# Patient Record
Sex: Female | Born: 1970 | Race: White | Hispanic: Yes | Marital: Single | Smoking: Never smoker
Health system: Southern US, Community
[De-identification: ages and names within clinical notes are randomized; demographics above are authoritative.]

## PROBLEM LIST (undated history)

## (undated) DIAGNOSIS — F419 Anxiety disorder, unspecified: Secondary | ICD-10-CM

## (undated) DIAGNOSIS — D649 Anemia, unspecified: Secondary | ICD-10-CM

## (undated) DIAGNOSIS — D219 Benign neoplasm of connective and other soft tissue, unspecified: Secondary | ICD-10-CM

## (undated) DIAGNOSIS — O09529 Supervision of elderly multigravida, unspecified trimester: Secondary | ICD-10-CM

## (undated) DIAGNOSIS — Z8742 Personal history of other diseases of the female genital tract: Secondary | ICD-10-CM

## (undated) DIAGNOSIS — N393 Stress incontinence (female) (male): Secondary | ICD-10-CM

## (undated) HISTORY — DX: Supervision of elderly multigravida, unspecified trimester: O09.529

## (undated) HISTORY — DX: Stress incontinence (female) (male): N39.3

## (undated) HISTORY — DX: Anemia, unspecified: D64.9

## (undated) HISTORY — DX: Personal history of other diseases of the female genital tract: Z87.42

## (undated) HISTORY — DX: Benign neoplasm of connective and other soft tissue, unspecified: D21.9

---

## 2003-09-16 ENCOUNTER — Other Ambulatory Visit: Admission: RE | Admit: 2003-09-16 | Discharge: 2003-09-16 | Payer: Self-pay | Admitting: Family Medicine

## 2008-07-15 ENCOUNTER — Ambulatory Visit (HOSPITAL_COMMUNITY): Admission: RE | Admit: 2008-07-15 | Discharge: 2008-07-15 | Payer: Self-pay | Admitting: Family Medicine

## 2011-05-28 ENCOUNTER — Other Ambulatory Visit: Payer: Self-pay | Admitting: Obstetrics & Gynecology

## 2011-05-28 DIAGNOSIS — Z139 Encounter for screening, unspecified: Secondary | ICD-10-CM

## 2011-06-04 ENCOUNTER — Ambulatory Visit (HOSPITAL_COMMUNITY)
Admission: RE | Admit: 2011-06-04 | Discharge: 2011-06-04 | Disposition: A | Discharge status: Home / Self Care | Payer: PRIVATE HEALTH INSURANCE | Source: Ambulatory Visit | Attending: Obstetrics & Gynecology | Admitting: Obstetrics & Gynecology

## 2011-06-04 DIAGNOSIS — Z139 Encounter for screening, unspecified: Secondary | ICD-10-CM

## 2011-06-04 DIAGNOSIS — Z1231 Encounter for screening mammogram for malignant neoplasm of breast: Secondary | ICD-10-CM | POA: Insufficient documentation

## 2012-06-26 ENCOUNTER — Other Ambulatory Visit: Payer: Self-pay | Admitting: Obstetrics & Gynecology

## 2012-06-26 DIAGNOSIS — Z139 Encounter for screening, unspecified: Secondary | ICD-10-CM

## 2012-07-21 ENCOUNTER — Other Ambulatory Visit: Payer: Self-pay | Admitting: Obstetrics & Gynecology

## 2012-07-21 ENCOUNTER — Other Ambulatory Visit (HOSPITAL_COMMUNITY)
Admission: RE | Admit: 2012-07-21 | Discharge: 2012-07-21 | Disposition: A | Discharge status: Home / Self Care | Payer: PRIVATE HEALTH INSURANCE | Source: Ambulatory Visit | Attending: Obstetrics & Gynecology | Admitting: Obstetrics & Gynecology

## 2012-07-21 ENCOUNTER — Ambulatory Visit (HOSPITAL_COMMUNITY)
Admission: RE | Admit: 2012-07-21 | Discharge: 2012-07-21 | Disposition: A | Discharge status: Home / Self Care | Payer: PRIVATE HEALTH INSURANCE | Source: Ambulatory Visit | Attending: Obstetrics & Gynecology | Admitting: Obstetrics & Gynecology

## 2012-07-21 DIAGNOSIS — Z1231 Encounter for screening mammogram for malignant neoplasm of breast: Secondary | ICD-10-CM | POA: Insufficient documentation

## 2012-07-21 DIAGNOSIS — Z01419 Encounter for gynecological examination (general) (routine) without abnormal findings: Secondary | ICD-10-CM | POA: Insufficient documentation

## 2012-07-21 DIAGNOSIS — Z139 Encounter for screening, unspecified: Secondary | ICD-10-CM

## 2012-07-21 DIAGNOSIS — R87810 Cervical high risk human papillomavirus (HPV) DNA test positive: Secondary | ICD-10-CM | POA: Insufficient documentation

## 2012-07-27 NOTE — Letter (Signed)
July 27, 2012  ATTN:  RE:      Loreta, Blouch [facility] # [id number]          Date of Service:   ADDRESS  [address] [place], [place] [address]  BODY  Dear Lafonda Mosses,  The Pap smear you had performed in our office on August 6th with Dr. 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  He would like to do a special procedure called a colposcopy and I am enclosing for your review 2 brochures, 1 on abnormal Pap and 1 on colposcopy.  If you will give our office a call at [phone], you can set that appointment up with Dr. Emelda Fear or Dr. Despina Hidden.          ______________________________ Adline Potter, ANP/GNP    JAG/MEDQ  D:  07/27/2012  T:  07/27/2012  Job:  [id number]

## 2013-07-22 ENCOUNTER — Other Ambulatory Visit: Payer: Self-pay

## 2013-07-22 DIAGNOSIS — Z1231 Encounter for screening mammogram for malignant neoplasm of breast: Secondary | ICD-10-CM

## 2013-07-26 ENCOUNTER — Ambulatory Visit
Admission: RE | Admit: 2013-07-26 | Discharge: 2013-07-26 | Disposition: A | Discharge status: Home / Self Care | Payer: BC Managed Care – PPO | Source: Ambulatory Visit

## 2013-07-26 DIAGNOSIS — Z1231 Encounter for screening mammogram for malignant neoplasm of breast: Secondary | ICD-10-CM

## 2013-08-03 ENCOUNTER — Other Ambulatory Visit: Payer: Self-pay | Admitting: Obstetrics & Gynecology

## 2013-08-23 ENCOUNTER — Other Ambulatory Visit (HOSPITAL_COMMUNITY)
Admission: RE | Admit: 2013-08-23 | Discharge: 2013-08-23 | Disposition: A | Discharge status: Home / Self Care | Payer: BC Managed Care – PPO | Source: Ambulatory Visit | Attending: Obstetrics & Gynecology | Admitting: Obstetrics & Gynecology

## 2013-08-23 ENCOUNTER — Ambulatory Visit (INDEPENDENT_AMBULATORY_CARE_PROVIDER_SITE_OTHER): Payer: BC Managed Care – PPO | Admitting: Obstetrics & Gynecology

## 2013-08-23 ENCOUNTER — Encounter: Payer: Self-pay | Admitting: Obstetrics & Gynecology

## 2013-08-23 ENCOUNTER — Other Ambulatory Visit: Payer: Self-pay | Admitting: Obstetrics & Gynecology

## 2013-08-23 VITALS — BP 90/60 | Ht 60.0 in | Wt 124.0 lb

## 2013-08-23 DIAGNOSIS — Z01419 Encounter for gynecological examination (general) (routine) without abnormal findings: Secondary | ICD-10-CM

## 2013-08-23 DIAGNOSIS — N841 Polyp of cervix uteri: Secondary | ICD-10-CM

## 2013-08-23 NOTE — Progress Notes (Signed)
Patient ID: Elizabeth [name], female   DOB: 1971/02/27, 42 y.o.   MRN: [id number] Subjective:     [name] is a 42 y.o. female here for a routine exam.  Patient's last menstrual period was 08/02/2013. No obstetric history on file. Current complaints: none.  Personal health questionnaire reviewed: no.   Gynecologic History Patient's last menstrual period was 08/02/2013. Contraception: none Last Pap: 2013. Results were: normal Last mammogram: 2014. Results were: normal  Obstetric History OB History  No data available     The following portions of the patient's history were reviewed and updated as appropriate: allergies, current medications, past family history, past medical history, past social history, past surgical history and problem list.  Review of Systems  Review of Systems  Constitutional: Negative for fever, chills, weight loss, malaise/fatigue and diaphoresis.  HENT: Negative for hearing loss, ear pain, nosebleeds, congestion, sore throat, neck pain, tinnitus and ear discharge.   Eyes: Negative for blurred vision, double vision, photophobia, pain, discharge and redness.  Respiratory: Negative for cough, hemoptysis, sputum production, shortness of breath, wheezing and stridor.   Cardiovascular: Negative for chest pain, palpitations, orthopnea, claudication, leg swelling and PND.  Gastrointestinal: negative for abdominal pain. Negative for heartburn, nausea, vomiting, diarrhea, constipation, blood in stool and melena.  Genitourinary: Negative for dysuria, urgency, frequency, hematuria and flank pain.  Musculoskeletal: Negative for myalgias, back pain, joint pain and falls.  Skin: Negative for itching and rash.  Neurological: Negative for dizziness, tingling, tremors, sensory change, speech change, focal weakness, seizures, loss of consciousness, weakness and headaches.  Endo/Heme/Allergies: Negative for environmental allergies and polydipsia. Does not bruise/bleed easily.   Psychiatric/Behavioral: Negative for depression, suicidal ideas, hallucinations, memory loss and substance abuse. The patient is not nervous/anxious and does not have insomnia.        Objective:    Physical Exam  Vitals reviewed. Constitutional: She is oriented to person, place, and time. She appears well-developed and well-nourished.  HENT:  Head: Normocephalic and atraumatic.        Right Ear: External ear normal.  Left Ear: External ear normal.  Nose: Nose normal.  Mouth/Throat: Oropharynx is clear and moist.  Eyes: Conjunctivae and EOM are normal. Pupils are equal, round, and reactive to light. Right eye exhibits no discharge. Left eye exhibits no discharge. No scleral icterus.  Neck: Normal range of motion. Neck supple. No tracheal deviation present. No thyromegaly present.  Cardiovascular: Normal rate, regular rhythm, normal heart sounds and intact distal pulses.  Exam reveals no gallop and no friction rub.   No murmur heard. Respiratory: Effort normal and breath sounds normal. No respiratory distress. She has no wheezes. She has no rales. She exhibits no tenderness.  GI: Soft. Bowel sounds are normal. She exhibits no distension and no mass. There is no tenderness. There is no rebound and no guarding.  Genitourinary:       Vulva is normal without lesions Vagina is pink moist without discharge Cervix normal in appearance and pap is done, small endocervical polyp seen and removed and sent to pathology Uterus is normal size shape and contour Adnexa is negative with normal sized ovaries   Musculoskeletal: Normal range of motion. She exhibits no edema and no tenderness.  Neurological: She is alert and oriented to person, place, and time. She has normal reflexes. She displays normal reflexes. No cranial nerve deficit. She exhibits normal muscle tone. Coordination normal.  Skin: Skin is warm and dry. No rash noted. No erythema. No pallor.  Psychiatric: She has  a normal mood and affect.  Her behavior is normal. Judgment and thought content normal.       Assessment:    Healthy female exam.    Plan:    Follow up in: 1 year.

## 2013-09-24 ENCOUNTER — Ambulatory Visit
Admission: RE | Admit: 2013-09-24 | Discharge: 2013-09-24 | Disposition: A | Discharge status: Home / Self Care | Payer: BC Managed Care – PPO | Source: Ambulatory Visit | Attending: Family Medicine | Admitting: Family Medicine

## 2013-09-24 ENCOUNTER — Other Ambulatory Visit: Payer: Self-pay | Admitting: Family Medicine

## 2013-09-24 DIAGNOSIS — R05 Cough: Secondary | ICD-10-CM

## 2013-09-24 DIAGNOSIS — R0789 Other chest pain: Secondary | ICD-10-CM

## 2014-03-04 ENCOUNTER — Inpatient Hospital Stay (HOSPITAL_COMMUNITY)
Admission: AD | Admit: 2014-03-04 | Payer: BC Managed Care – PPO | Source: Ambulatory Visit | Admitting: Obstetrics & Gynecology

## 2014-03-04 LAB — OB RESULTS CONSOLE GC/CHLAMYDIA
CHLAMYDIA, DNA PROBE: NEGATIVE
GC PROBE AMP, GENITAL: NEGATIVE

## 2014-03-04 LAB — OB RESULTS CONSOLE ANTIBODY SCREEN: ANTIBODY SCREEN: NEGATIVE

## 2014-03-04 LAB — OB RESULTS CONSOLE ABO/RH: RH Type: POSITIVE

## 2014-03-04 LAB — OB RESULTS CONSOLE HEPATITIS B SURFACE ANTIGEN: Hepatitis B Surface Ag: NEGATIVE

## 2014-03-04 LAB — OB RESULTS CONSOLE HIV ANTIBODY (ROUTINE TESTING): HIV: NONREACTIVE

## 2014-03-04 LAB — OB RESULTS CONSOLE RUBELLA ANTIBODY, IGM: Rubella: IMMUNE

## 2014-03-04 LAB — OB RESULTS CONSOLE RPR: RPR: NONREACTIVE

## 2014-08-17 LAB — OB RESULTS CONSOLE GBS: GBS: NEGATIVE

## 2014-09-07 ENCOUNTER — Encounter (HOSPITAL_COMMUNITY): Payer: Self-pay | Admitting: *Deleted

## 2014-09-07 ENCOUNTER — Telehealth (HOSPITAL_COMMUNITY): Payer: Self-pay | Admitting: *Deleted

## 2014-09-07 NOTE — Telephone Encounter (Signed)
Preadmission screen  

## 2014-09-13 ENCOUNTER — Other Ambulatory Visit: Payer: Self-pay | Admitting: Obstetrics & Gynecology

## 2014-09-18 ENCOUNTER — Inpatient Hospital Stay (HOSPITAL_COMMUNITY)
Admission: RE | Admit: 2014-09-18 | Discharge: 2014-09-22 | DRG: 766 | Disposition: A | Discharge status: Home / Self Care | Payer: BC Managed Care – PPO | Source: Ambulatory Visit | Attending: Obstetrics & Gynecology | Admitting: Obstetrics & Gynecology

## 2014-09-18 ENCOUNTER — Inpatient Hospital Stay (HOSPITAL_COMMUNITY): Payer: BC Managed Care – PPO | Admitting: Anesthesiology

## 2014-09-18 ENCOUNTER — Encounter (HOSPITAL_COMMUNITY): Payer: BC Managed Care – PPO | Admitting: Anesthesiology

## 2014-09-18 ENCOUNTER — Encounter (HOSPITAL_COMMUNITY): Payer: Self-pay

## 2014-09-18 DIAGNOSIS — O3483 Maternal care for other abnormalities of pelvic organs, third trimester: Secondary | ICD-10-CM | POA: Diagnosis present

## 2014-09-18 DIAGNOSIS — O3413 Maternal care for benign tumor of corpus uteri, third trimester: Secondary | ICD-10-CM | POA: Diagnosis present

## 2014-09-18 DIAGNOSIS — Z3A39 39 weeks gestation of pregnancy: Secondary | ICD-10-CM | POA: Diagnosis present

## 2014-09-18 DIAGNOSIS — O09513 Supervision of elderly primigravida, third trimester: Secondary | ICD-10-CM | POA: Diagnosis not present

## 2014-09-18 DIAGNOSIS — Z3403 Encounter for supervision of normal first pregnancy, third trimester: Secondary | ICD-10-CM | POA: Diagnosis present

## 2014-09-18 DIAGNOSIS — N8320 Unspecified ovarian cysts: Secondary | ICD-10-CM | POA: Diagnosis present

## 2014-09-18 DIAGNOSIS — Z8249 Family history of ischemic heart disease and other diseases of the circulatory system: Secondary | ICD-10-CM

## 2014-09-18 DIAGNOSIS — D259 Leiomyoma of uterus, unspecified: Secondary | ICD-10-CM | POA: Diagnosis present

## 2014-09-18 DIAGNOSIS — O3663X0 Maternal care for excessive fetal growth, third trimester, not applicable or unspecified: Secondary | ICD-10-CM | POA: Diagnosis present

## 2014-09-18 HISTORY — DX: Anxiety disorder, unspecified: F41.9

## 2014-09-18 LAB — ABO/RH: ABO/RH(D): O POS

## 2014-09-18 LAB — CBC
HEMATOCRIT: 34.9 % — AB (ref 36.0–46.0)
Hemoglobin: 12.3 g/dL (ref 12.0–15.0)
MCH: 32.6 pg (ref 26.0–34.0)
MCHC: 35.2 g/dL (ref 30.0–36.0)
MCV: 92.6 fL (ref 78.0–100.0)
Platelets: 210 10*3/uL (ref 150–400)
RBC: 3.77 MIL/uL — ABNORMAL LOW (ref 3.87–5.11)
RDW: 13.8 % (ref 11.5–15.5)
WBC: 6.6 10*3/uL (ref 4.0–10.5)

## 2014-09-18 LAB — TYPE AND SCREEN
ABO/RH(D): O POS
ANTIBODY SCREEN: NEGATIVE

## 2014-09-18 LAB — RPR

## 2014-09-18 MED ORDER — PHENYLEPHRINE 40 MCG/ML (10ML) SYRINGE FOR IV PUSH (FOR BLOOD PRESSURE SUPPORT): 80.0000 ug | PREFILLED_SYRINGE | INTRAVENOUS | Status: DC | PRN

## 2014-09-18 MED ORDER — LIDOCAINE HCL (PF) 1 % IJ SOLN
30.0000 mL | INTRAMUSCULAR | Status: DC | PRN
  Filled 2014-09-18: qty 30

## 2014-09-18 MED ORDER — PHENYLEPHRINE 40 MCG/ML (10ML) SYRINGE FOR IV PUSH (FOR BLOOD PRESSURE SUPPORT)
80.0000 ug | PREFILLED_SYRINGE | INTRAVENOUS | Status: DC | PRN
  Filled 2014-09-18: qty 10

## 2014-09-18 MED ORDER — OXYCODONE-ACETAMINOPHEN 5-325 MG PO TABS
1.0000 | ORAL_TABLET | ORAL | Status: DC | PRN
Start: 2014-09-18 — End: 2014-09-19

## 2014-09-18 MED ORDER — TERBUTALINE SULFATE 1 MG/ML IJ SOLN: 0.2500 mg | Freq: Once | INTRAMUSCULAR | Status: AC | PRN

## 2014-09-18 MED ORDER — EPHEDRINE 5 MG/ML INJ: 10.0000 mg | INTRAVENOUS | Status: DC | PRN

## 2014-09-18 MED ORDER — CITRIC ACID-SODIUM CITRATE 334-500 MG/5ML PO SOLN
30.0000 mL | ORAL | Status: DC | PRN
  Administered 2014-09-18: 30 mL via ORAL
  Filled 2014-09-18: qty 15

## 2014-09-18 MED ORDER — FENTANYL 2.5 MCG/ML BUPIVACAINE 1/10 % EPIDURAL INFUSION (WH - ANES)
14.0000 mL/h | INTRAMUSCULAR | Status: DC | PRN
  Administered 2014-09-18: 14 mL/h via EPIDURAL

## 2014-09-18 MED ORDER — ONDANSETRON HCL 4 MG/2ML IJ SOLN: 4.0000 mg | Freq: Four times a day (QID) | INTRAMUSCULAR | Status: DC | PRN

## 2014-09-18 MED ORDER — OXYTOCIN 40 UNITS IN LACTATED RINGERS INFUSION - SIMPLE MED
1.0000 m[IU]/min | INTRAVENOUS | Status: DC
  Administered 2014-09-18: 2 m[IU]/min via INTRAVENOUS
  Administered 2014-09-19: 40 [IU] via INTRAVENOUS

## 2014-09-18 MED ORDER — LIDOCAINE HCL (PF) 1 % IJ SOLN
INTRAMUSCULAR | Status: DC | PRN
  Administered 2014-09-18 (×2): 5 mL

## 2014-09-18 MED ORDER — LACTATED RINGERS IV SOLN
500.0000 mL | Freq: Once | INTRAVENOUS | Status: AC
  Administered 2014-09-18: 17:00:00 via INTRAVENOUS

## 2014-09-18 MED ORDER — CEFAZOLIN SODIUM-DEXTROSE 2-3 GM-% IV SOLR
2.0000 g | INTRAVENOUS | Status: AC
  Administered 2014-09-19: 2 g via INTRAVENOUS
  Filled 2014-09-18: qty 50

## 2014-09-18 MED ORDER — ACETAMINOPHEN 325 MG PO TABS: 650.0000 mg | ORAL_TABLET | ORAL | Status: DC | PRN

## 2014-09-18 MED ORDER — PHENYLEPHRINE 40 MCG/ML (10ML) SYRINGE FOR IV PUSH (FOR BLOOD PRESSURE SUPPORT)
PREFILLED_SYRINGE | INTRAVENOUS | Status: DC
Start: 2014-09-18 — End: 2014-09-18
  Filled 2014-09-18: qty 10

## 2014-09-18 MED ORDER — DIPHENHYDRAMINE HCL 50 MG/ML IJ SOLN: 12.5000 mg | INTRAMUSCULAR | Status: DC | PRN

## 2014-09-18 MED ORDER — OXYCODONE-ACETAMINOPHEN 5-325 MG PO TABS
2.0000 | ORAL_TABLET | ORAL | Status: DC | PRN
Start: 2014-09-18 — End: 2014-09-19

## 2014-09-18 MED ORDER — LACTATED RINGERS IV SOLN: 500.0000 mL | INTRAVENOUS | Status: DC | PRN

## 2014-09-18 MED ORDER — FENTANYL 2.5 MCG/ML BUPIVACAINE 1/10 % EPIDURAL INFUSION (WH - ANES)
INTRAMUSCULAR | Status: AC
  Administered 2014-09-18: 14 mL/h via EPIDURAL
  Filled 2014-09-18: qty 125

## 2014-09-18 MED ORDER — OXYTOCIN 40 UNITS IN LACTATED RINGERS INFUSION - SIMPLE MED
1.0000 m[IU]/min | INTRAVENOUS | Status: DC
  Filled 2014-09-18: qty 1000

## 2014-09-18 MED ORDER — LACTATED RINGERS IV SOLN
INTRAVENOUS | Status: DC
  Administered 2014-09-18: 20:00:00 via INTRAVENOUS

## 2014-09-18 MED ORDER — OXYTOCIN 40 UNITS IN LACTATED RINGERS INFUSION - SIMPLE MED: 62.5000 mL/h | INTRAVENOUS | Status: DC

## 2014-09-18 MED ORDER — OXYTOCIN BOLUS FROM INFUSION: 500.0000 mL | INTRAVENOUS | Status: DC

## 2014-09-18 MED ORDER — SODIUM BICARBONATE 8.4 % IV SOLN
INTRAVENOUS | Status: DC | PRN
  Administered 2014-09-18 – 2014-09-19 (×3): 5 mL via EPIDURAL

## 2014-09-18 NOTE — Anesthesia Procedure Notes (Signed)
Epidural Patient location during procedure: OB Start time: 09/18/2014 5:00 PM  Staffing Anesthesiologist: Rudean Curt Performed by: anesthesiologist   Preanesthetic Checklist Completed: patient identified, site marked, surgical consent, pre-op evaluation, timeout performed, IV checked, risks and benefits discussed and monitors and equipment checked  Epidural Patient position: sitting Prep: site prepped and draped and DuraPrep Patient monitoring: continuous pulse ox and blood pressure Approach: midline Location: L3-L4 Injection technique: LOR air  Needle:  Needle type: Tuohy  Needle gauge: 17 G Needle length: 9 cm and 9 Needle insertion depth: 5 cm cm Catheter type: closed end flexible Catheter size: 19 Gauge Catheter at skin depth: 10 cm Test dose: negative  Assessment Events: blood not aspirated, injection not painful, no injection resistance, negative IV test and no paresthesia  Additional Notes Patient identified.  Risk benefits discussed including failed block, incomplete pain control, headache, nerve damage, paralysis, blood pressure changes, nausea, vomiting, reactions to medication both toxic or allergic, and postpartum back pain.  Patient expressed understanding and wished to proceed.  All questions were answered.  Sterile technique used throughout procedure and epidural site dressed with sterile barrier dressing. No paresthesia or other complications noted.The patient did not experience any signs of intravascular injection such as tinnitus or metallic taste in mouth nor signs of intrathecal spread such as rapid motor block. Please see nursing notes for vital signs.

## 2014-09-18 NOTE — Progress Notes (Signed)
Elizabeth [name] is a 43 y.o. G1P0 at [redacted]w[redacted]d by sono. Here for labor AOL at 4-5 cm. EFW 9 + lbs. Uterine fiboids, AMA.  On Low dose pitocin, s/p epidural, progressed to 9+ cm at 8pm and complete at around 9 pm. Feels rectal pressure, but pushing was deferred an hour back due to high station. Will reassess and start pushing if lower station.   Objective: BP 112/57  Pulse 81  Temp(Src) 98.4 F (36.9 C) (Oral)  Resp 18  Ht 5\' 1"  (1.549 m)  Wt 158 lb (71.668 kg)  BMI 29.87 kg/m2  SpO2 100%   FHT:  FHR: 150 bpm, variability: moderate,  accelerations:  Present, decels: absent  Toco-   3 min  Assessment / Plan: Passive decent attempted x 2 hrs, start pushing if station lower and reassess progress. Continue pitocin. FHT -I. EFW 9 lbs. GBS neg.    Elizabeth [name] R 09/18/2014, 11:00 PM

## 2014-09-18 NOTE — H&P (Signed)
Elizabeth [name] is a 43 y.o. female presenting for labor AOL at 39 wks since cervix 5 cm dilated.  [facility], Dr Benjie Karvonen primary.  43 yo, AMA, spontaneous pregnancy, has fibroids and LGA. Passed Glucose screening, Informaseq normal XX, AFP negative. Gradual cervical dilatation since 34 wks, no leaking/ bleeding and feels good FMs. ANteig with BPP and AFI vs NSTs since 36 wks has been reactive and 8/8. Growth sono since 28 wks noted LGA, last sono at 36 wks noted 95% at 7'11" with AC at 98%.  History OB History   Grav Para Term Preterm Abortions TAB SAB Ect Mult Living   1              Past Medical History  Diagnosis Date  . Anemia   . History of ovarian cyst   . AMA (advanced maternal age) multigravida 65+   . Fibroid   . Female stress incontinence   . Anxiety    Past Surgical History  Procedure Laterality Date  . No past surgeries     Family History: family history includes Cancer in her father; Colon cancer in her father; Hypertension in her father. Social History:  reports that she has never smoked. She has never used smokeless tobacco. She reports that she does not drink alcohol or use illicit drugs.   Prenatal Transfer Tool  Maternal Diabetes: No Genetic Screening: Normal Informaseq and AFP1 negative Maternal Ultrasounds/Referrals: Normal but LGA -95% overall and AC at 98% Fetal Ultrasounds or other Referrals:  None Maternal Substance Abuse:  No Significant Maternal Medications:  Meds include: Other: Pepcid Significant Maternal Lab Results:  Lab values include: Group B Strep negative Other Comments:  None  ROS neg   Dilation: 5 Effacement (%): 50 Station: -1 Exam by:: lee Blood pressure 112/75, pulse 91, temperature 98.2 F (36.8 C), temperature source Oral, resp. rate 18, height 5\' 1"  (1.549 m), weight 158 lb (71.668 kg). Exam Physical Exam  Physical exam:  A&O x 3, no acute distress. Pleasant HEENT neg, no thyromegaly Lungs CTA bilat CV RRR, S1S2  normal Abdo soft, non tender, non acute Extr no edema/ tenderness Pelvic above FHT 130s/ + accels/ no decels/ mod variab- category I Toco irreg   Prenatal labs: ABO, Rh: --/--/O POS (10/04 0740) Antibody: NEG (10/04 0740) Rubella: Immune (03/20 0000) RPR: Nonreactive (03/20 0000)  HBsAg: Negative (03/20 0000)  HIV: Non-reactive (03/20 0000)  GBS: Negative (09/02 0000)   Assessment/Plan: 43 yo, AMA G1 at 39 wks for labor AOL. GBS negative, Large fibroids, placenta lateral, LGA, EFW 9 lbs. Pitocin, epidural as desired. FHT- category I  Gunner Iodice R 09/18/2014, 9:57 AM

## 2014-09-18 NOTE — Progress Notes (Signed)
Patient ID: Elizabeth [name], female   DOB: 06-05-1971, 43 y.o.   MRN: [id number] Subjective: No complaints.   Objective: BP 122/67  Pulse 80  Temp(Src) 98.2 F (36.8 C) (Oral)  Resp 16  Ht 5\' 1"  (1.549 m)  Wt 158 lb (71.668 kg)  BMI 29.87 kg/m2  FHT:  FHR: 145 bpm, variability: moderate,  accelerations:  Present,  decelerations:  Absent UC:   regular, every 3-4 minutes SVE:   Dilation: 4 Effacement (%): 50 Station: -2 Exam by:: Ariea Rochin AROM clear fluid.   Assessment / Plan: Induction of labor due to AMA, advanced cervical dilatation, LGA. ,  progressing well on pitocin  Fetal Wellbeing:  Category I Pain Control:  Labor support without medications   Dougles Kimmey R 09/18/2014, 11:26 AM

## 2014-09-18 NOTE — Anesthesia Preprocedure Evaluation (Addendum)
Anesthesia Evaluation  Patient identified by MRN, date of birth, ID band Patient awake    Reviewed: Allergy & Precautions, H&P , NPO status , Patient's Chart, lab work & pertinent test results  Airway Mallampati: II TM Distance: >3 FB Neck ROM: full    Dental  (+) Teeth Intact   Pulmonary neg pulmonary ROS,  breath sounds clear to auscultation  Pulmonary exam normal       Cardiovascular negative cardio ROS  Rhythm:regular Rate:Normal     Neuro/Psych PSYCHIATRIC DISORDERS Anxiety negative neurological ROS     GI/Hepatic negative GI ROS, Neg liver ROS,   Endo/Other  negative endocrine ROS  Renal/GU negative Renal ROS  negative genitourinary   Musculoskeletal negative musculoskeletal ROS (+)   Abdominal   Peds  Hematology  (+) anemia ,   Anesthesia Other Findings   Reproductive/Obstetrics (+) Pregnancy                          Anesthesia Physical Anesthesia Plan  ASA: II and emergent  Anesthesia Plan: Epidural   Post-op Pain Management:    Induction:   Airway Management Planned: Natural Airway  Additional Equipment:   Intra-op Plan:   Post-operative Plan:   Informed Consent: I have reviewed the patients History and Physical, chart, labs and discussed the procedure including the risks, benefits and alternatives for the proposed anesthesia with the patient or authorized representative who has indicated his/her understanding and acceptance.     Plan Discussed with: Anesthesiologist, CRNA and Surgeon  Anesthesia Plan Comments:        Anesthesia Quick Evaluation

## 2014-09-18 NOTE — Progress Notes (Signed)
Elizabeth [name] is a 43 y.o. G1P0 at [redacted]w[redacted]d by sono. Labor AOL at 4-5 cm. EFW 9 + lbs. Uterine fiboids, AMA.   On Low dose pitocin, s/p epidural, progressed well to complete at 9 pm, station 0, tried exaggerated Sims but no further decent noted after almost 3 hrs. Station still at 0. Tried a few pushed with no change in station and considering station in mid pelvic, no further pushing is advised.  Suspect related to CPD/ LGA/ possible fibroids related   FHT:  FHR: 150 bpm, variability: moderate,  accelerations:  Present, decels: absent - category I Toco-   3 min, pitocin stopped.   Assessment / Plan: Failed decent past 0 station, 3 hrs. Proceed with C/section. Risks/complications of surgery reviewed incl infection, bleeding, damage to internal organs including bladder, bowels, ureters, blood vessels, other risks from anesthesia, VTE and delayed complications of any surgery, complications in future surgery reviewed. Also discussed neonatal complications incl difficult delivery, laceration, vacuum assistance, TTN etc. Pt understands and agrees, all concerns addressed.      Elizabeth [name] R 09/18/2014, 11:52 PM

## 2014-09-18 NOTE — Progress Notes (Signed)
Elizabeth [name] is a 43 y.o. G1P0 at [redacted]w[redacted]d by sono. Here for labor AOL at 4-5 cm. EFW 9 + lbs. Uterine fiboids, AMA.  On Low dose pitocin, UCs are now painful.   Objective: BP 122/70  Pulse 70  Temp(Src) 98.6 F (37 C) (Oral)  Resp 14  Ht 5\' 1"  (1.549 m)  Wt 158 lb (71.668 kg)  BMI 29.87 kg/m2   FHT:  FHR: 150 bpm, variability: moderate,  accelerations:  Present, decels: absent  UC:   regular, every 2 minutes  (cut back pitocin from 12 to 8 mu rate) SVE:   Dilation: 5 Effacement (%): 100 Station: -1 Exam by:: Dr. Benjie Karvonen  Assessment / Plan: Protracted latent phase, continue pitocin. FHT -I. EFW 9 lbs. GBS neg. Pain mngmt options reviewed.   Mehul Rudin R 09/18/2014, 4:34 PM

## 2014-09-19 ENCOUNTER — Encounter (HOSPITAL_COMMUNITY): Payer: Self-pay

## 2014-09-19 ENCOUNTER — Encounter (HOSPITAL_COMMUNITY)
Admission: RE | Disposition: A | Discharge status: Home / Self Care | Payer: Self-pay | Source: Ambulatory Visit | Attending: Obstetrics & Gynecology

## 2014-09-19 LAB — CBC
HCT: 35 % — ABNORMAL LOW (ref 36.0–46.0)
Hemoglobin: 12.1 g/dL (ref 12.0–15.0)
MCH: 32 pg (ref 26.0–34.0)
MCHC: 34.6 g/dL (ref 30.0–36.0)
MCV: 92.6 fL (ref 78.0–100.0)
PLATELETS: 193 10*3/uL (ref 150–400)
RBC: 3.78 MIL/uL — AB (ref 3.87–5.11)
RDW: 13.7 % (ref 11.5–15.5)
WBC: 15.5 10*3/uL — AB (ref 4.0–10.5)

## 2014-09-19 SURGERY — Surgical Case
Anesthesia: Epidural

## 2014-09-19 MED ORDER — MEPERIDINE HCL 25 MG/ML IJ SOLN: 6.2500 mg | INTRAMUSCULAR | Status: DC | PRN

## 2014-09-19 MED ORDER — SCOPOLAMINE 1 MG/3DAYS TD PT72
1.0000 | MEDICATED_PATCH | Freq: Once | TRANSDERMAL | Status: DC
Start: 2014-09-19 — End: 2014-09-22

## 2014-09-19 MED ORDER — OXYCODONE-ACETAMINOPHEN 5-325 MG PO TABS: 2.0000 | ORAL_TABLET | ORAL | Status: DC | PRN

## 2014-09-19 MED ORDER — OXYCODONE-ACETAMINOPHEN 5-325 MG PO TABS
1.0000 | ORAL_TABLET | ORAL | Status: DC | PRN
  Administered 2014-09-20 – 2014-09-21 (×3): 1 via ORAL
  Filled 2014-09-19 (×3): qty 1

## 2014-09-19 MED ORDER — SIMETHICONE 80 MG PO CHEW
80.0000 mg | CHEWABLE_TABLET | ORAL | Status: DC | PRN
  Administered 2014-09-20: 80 mg via ORAL

## 2014-09-19 MED ORDER — DEXTROSE 5 % IV SOLN: 1.0000 ug/kg/h | INTRAVENOUS | Status: DC | PRN

## 2014-09-19 MED ORDER — ZOLPIDEM TARTRATE 5 MG PO TABS
5.0000 mg | ORAL_TABLET | Freq: Every evening | ORAL | Status: DC | PRN
Start: 2014-09-19 — End: 2014-09-20

## 2014-09-19 MED ORDER — IBUPROFEN 600 MG PO TABS
600.0000 mg | ORAL_TABLET | Freq: Four times a day (QID) | ORAL | Status: DC
  Administered 2014-09-19 – 2014-09-22 (×12): 600 mg via ORAL
  Filled 2014-09-19 (×12): qty 1

## 2014-09-19 MED ORDER — FENTANYL CITRATE 0.05 MG/ML IJ SOLN
INTRAMUSCULAR | Status: AC
  Filled 2014-09-19: qty 2

## 2014-09-19 MED ORDER — ONDANSETRON HCL 4 MG/2ML IJ SOLN
INTRAMUSCULAR | Status: AC
  Filled 2014-09-19: qty 2

## 2014-09-19 MED ORDER — TETANUS-DIPHTH-ACELL PERTUSSIS 5-2.5-18.5 LF-MCG/0.5 IM SUSP: 0.5000 mL | Freq: Once | INTRAMUSCULAR | Status: DC

## 2014-09-19 MED ORDER — SCOPOLAMINE 1 MG/3DAYS TD PT72
MEDICATED_PATCH | TRANSDERMAL | Status: DC | PRN
  Administered 2014-09-19: 1 via TRANSDERMAL

## 2014-09-19 MED ORDER — SCOPOLAMINE 1 MG/3DAYS TD PT72
MEDICATED_PATCH | TRANSDERMAL | Status: AC
  Filled 2014-09-19: qty 1

## 2014-09-19 MED ORDER — KETOROLAC TROMETHAMINE 30 MG/ML IJ SOLN
INTRAMUSCULAR | Status: AC
  Filled 2014-09-19: qty 1

## 2014-09-19 MED ORDER — FAMOTIDINE 20 MG PO TABS
20.0000 mg | ORAL_TABLET | Freq: Two times a day (BID) | ORAL | Status: DC
  Administered 2014-09-19 – 2014-09-22 (×7): 20 mg via ORAL
  Filled 2014-09-19 (×7): qty 1

## 2014-09-19 MED ORDER — PRENATAL MULTIVITAMIN CH
1.0000 | ORAL_TABLET | Freq: Every day | ORAL | Status: DC
  Administered 2014-09-19 – 2014-09-21 (×3): 1 via ORAL
  Filled 2014-09-19 (×3): qty 1

## 2014-09-19 MED ORDER — SODIUM BICARBONATE 8.4 % IV SOLN
INTRAVENOUS | Status: AC
  Filled 2014-09-19: qty 50

## 2014-09-19 MED ORDER — DIBUCAINE 1 % RE OINT: 1.0000 "application " | TOPICAL_OINTMENT | RECTAL | Status: DC | PRN

## 2014-09-19 MED ORDER — OXYTOCIN 40 UNITS IN LACTATED RINGERS INFUSION - SIMPLE MED: 62.5000 mL/h | INTRAVENOUS | Status: AC

## 2014-09-19 MED ORDER — DEXAMETHASONE SODIUM PHOSPHATE 10 MG/ML IJ SOLN
INTRAMUSCULAR | Status: AC
  Filled 2014-09-19: qty 1

## 2014-09-19 MED ORDER — LIDOCAINE-EPINEPHRINE (PF) 2 %-1:200000 IJ SOLN
INTRAMUSCULAR | Status: AC
  Filled 2014-09-19: qty 20

## 2014-09-19 MED ORDER — NALBUPHINE HCL 10 MG/ML IJ SOLN: 5.0000 mg | Freq: Once | INTRAMUSCULAR | Status: AC | PRN

## 2014-09-19 MED ORDER — SIMETHICONE 80 MG PO CHEW
80.0000 mg | CHEWABLE_TABLET | ORAL | Status: DC
  Administered 2014-09-20 – 2014-09-22 (×2): 80 mg via ORAL
  Filled 2014-09-19 (×3): qty 1

## 2014-09-19 MED ORDER — DIPHENHYDRAMINE HCL 25 MG PO CAPS
25.0000 mg | ORAL_CAPSULE | Freq: Four times a day (QID) | ORAL | Status: DC | PRN
  Administered 2014-09-20 – 2014-09-22 (×3): 25 mg via ORAL
  Filled 2014-09-19 (×4): qty 1

## 2014-09-19 MED ORDER — FENTANYL CITRATE 0.05 MG/ML IJ SOLN
INTRAMUSCULAR | Status: DC | PRN
  Administered 2014-09-19: 100 ug via INTRAVENOUS
  Administered 2014-09-19 (×2): 50 ug via INTRAVENOUS

## 2014-09-19 MED ORDER — NALBUPHINE HCL 10 MG/ML IJ SOLN: 5.0000 mg | INTRAMUSCULAR | Status: DC | PRN

## 2014-09-19 MED ORDER — DEXAMETHASONE SODIUM PHOSPHATE 10 MG/ML IJ SOLN
INTRAMUSCULAR | Status: DC | PRN
  Administered 2014-09-19: 10 mg via INTRAVENOUS

## 2014-09-19 MED ORDER — WITCH HAZEL-GLYCERIN EX PADS: 1.0000 "application " | MEDICATED_PAD | CUTANEOUS | Status: DC | PRN

## 2014-09-19 MED ORDER — SIMETHICONE 80 MG PO CHEW
80.0000 mg | CHEWABLE_TABLET | Freq: Three times a day (TID) | ORAL | Status: DC
  Administered 2014-09-19 – 2014-09-22 (×7): 80 mg via ORAL
  Filled 2014-09-19 (×7): qty 1

## 2014-09-19 MED ORDER — ONDANSETRON HCL 4 MG PO TABS: 4.0000 mg | ORAL_TABLET | ORAL | Status: DC | PRN

## 2014-09-19 MED ORDER — KETOROLAC TROMETHAMINE 30 MG/ML IJ SOLN
30.0000 mg | Freq: Four times a day (QID) | INTRAMUSCULAR | Status: AC | PRN
Start: 2014-09-19 — End: 2014-09-20

## 2014-09-19 MED ORDER — ONDANSETRON HCL 4 MG/2ML IJ SOLN: 4.0000 mg | Freq: Three times a day (TID) | INTRAMUSCULAR | Status: DC | PRN

## 2014-09-19 MED ORDER — ONDANSETRON HCL 4 MG/2ML IJ SOLN: 4.0000 mg | INTRAMUSCULAR | Status: DC | PRN

## 2014-09-19 MED ORDER — DIPHENHYDRAMINE HCL 50 MG/ML IJ SOLN
12.5000 mg | INTRAMUSCULAR | Status: DC | PRN
Start: 2014-09-19 — End: 2014-09-20

## 2014-09-19 MED ORDER — ONDANSETRON HCL 4 MG/2ML IJ SOLN
INTRAMUSCULAR | Status: DC | PRN
  Administered 2014-09-19: 4 mg via INTRAVENOUS

## 2014-09-19 MED ORDER — DIPHENHYDRAMINE HCL 25 MG PO CAPS
25.0000 mg | ORAL_CAPSULE | ORAL | Status: DC | PRN
  Administered 2014-09-19: 25 mg via ORAL

## 2014-09-19 MED ORDER — LACTATED RINGERS IV SOLN
INTRAVENOUS | Status: DC | PRN
  Administered 2014-09-19: 01:00:00 via INTRAVENOUS

## 2014-09-19 MED ORDER — SODIUM CHLORIDE 0.9 % IJ SOLN: 3.0000 mL | INTRAMUSCULAR | Status: DC | PRN

## 2014-09-19 MED ORDER — LACTATED RINGERS IV SOLN
INTRAVENOUS | Status: DC
  Administered 2014-09-19: 10:00:00 via INTRAVENOUS

## 2014-09-19 MED ORDER — MORPHINE SULFATE (PF) 0.5 MG/ML IJ SOLN
INTRAMUSCULAR | Status: DC | PRN
  Administered 2014-09-19: 4 mg via EPIDURAL
  Administered 2014-09-19: 1 mg via INTRAVENOUS

## 2014-09-19 MED ORDER — SENNOSIDES-DOCUSATE SODIUM 8.6-50 MG PO TABS
2.0000 | ORAL_TABLET | ORAL | Status: DC
  Administered 2014-09-20 – 2014-09-22 (×3): 2 via ORAL
  Filled 2014-09-19 (×3): qty 2

## 2014-09-19 MED ORDER — OXYTOCIN 10 UNIT/ML IJ SOLN
INTRAMUSCULAR | Status: AC
  Filled 2014-09-19: qty 4

## 2014-09-19 MED ORDER — KETOROLAC TROMETHAMINE 30 MG/ML IJ SOLN
30.0000 mg | Freq: Four times a day (QID) | INTRAMUSCULAR | Status: AC | PRN
Start: 2014-09-19 — End: 2014-09-20
  Administered 2014-09-19: 30 mg via INTRAVENOUS

## 2014-09-19 MED ORDER — METHYLERGONOVINE MALEATE 0.2 MG/ML IJ SOLN
INTRAMUSCULAR | Status: DC | PRN
  Administered 2014-09-19: 0.2 mg via INTRAMUSCULAR

## 2014-09-19 MED ORDER — NALOXONE HCL 0.4 MG/ML IJ SOLN: 0.4000 mg | INTRAMUSCULAR | Status: DC | PRN

## 2014-09-19 MED ORDER — LANOLIN HYDROUS EX OINT: 1.0000 "application " | TOPICAL_OINTMENT | CUTANEOUS | Status: DC | PRN

## 2014-09-19 MED ORDER — MENTHOL 3 MG MT LOZG
1.0000 | LOZENGE | OROMUCOSAL | Status: DC | PRN
  Filled 2014-09-19: qty 9

## 2014-09-19 MED ORDER — MORPHINE SULFATE 0.5 MG/ML IJ SOLN
INTRAMUSCULAR | Status: AC
  Filled 2014-09-19: qty 10

## 2014-09-19 MED ORDER — FENTANYL CITRATE 0.05 MG/ML IJ SOLN: 25.0000 ug | INTRAMUSCULAR | Status: DC | PRN

## 2014-09-19 SURGICAL SUPPLY — 44 items
APL SKNCLS STERI-STRIP NONHPOA (GAUZE/BANDAGES/DRESSINGS) ×1
BENZOIN TINCTURE PRP APPL 2/3 (GAUZE/BANDAGES/DRESSINGS) ×2 IMPLANT
CLAMP CORD UMBIL (MISCELLANEOUS) IMPLANT
CLOSURE STERI-STRIP 1/4X4 (GAUZE/BANDAGES/DRESSINGS) ×2 IMPLANT
CLOSURE WOUND 1/2 X4 (GAUZE/BANDAGES/DRESSINGS)
CLOTH BEACON ORANGE TIMEOUT ST (SAFETY) ×3 IMPLANT
CONTAINER PREFILL 10% NBF 15ML (MISCELLANEOUS) IMPLANT
COVER LIGHT HANDLE  1/PK (MISCELLANEOUS) ×4
COVER LIGHT HANDLE 1/PK (MISCELLANEOUS) ×2 IMPLANT
DRAPE SHEET LG 3/4 BI-LAMINATE (DRAPES) IMPLANT
DRSG OPSITE POSTOP 4X10 (GAUZE/BANDAGES/DRESSINGS) ×3 IMPLANT
DURAPREP 26ML APPLICATOR (WOUND CARE) ×3 IMPLANT
ELECT REM PT RETURN 9FT ADLT (ELECTROSURGICAL) ×3
ELECTRODE REM PT RTRN 9FT ADLT (ELECTROSURGICAL) ×1 IMPLANT
EXTRACTOR VACUUM KIWI (MISCELLANEOUS) IMPLANT
EXTRACTOR VACUUM M CUP 4 TUBE (SUCTIONS) IMPLANT
EXTRACTOR VACUUM M CUP 4' TUBE (SUCTIONS)
GLOVE BIO SURGEON STRL SZ7 (GLOVE) ×3 IMPLANT
GLOVE BIOGEL PI IND STRL 7.0 (GLOVE) ×1 IMPLANT
GLOVE BIOGEL PI INDICATOR 7.0 (GLOVE) ×2
GOWN STRL REUS W/TWL LRG LVL3 (GOWN DISPOSABLE) ×6 IMPLANT
KIT ABG SYR 3ML LUER SLIP (SYRINGE) IMPLANT
NEEDLE HYPO 25X5/8 SAFETYGLIDE (NEEDLE) IMPLANT
NS IRRIG 1000ML POUR BTL (IV SOLUTION) ×3 IMPLANT
PACK C SECTION WH (CUSTOM PROCEDURE TRAY) ×3 IMPLANT
PAD ABD 8X10 STRL (GAUZE/BANDAGES/DRESSINGS) ×2 IMPLANT
PAD OB MATERNITY 4.3X12.25 (PERSONAL CARE ITEMS) ×3 IMPLANT
RTRCTR C-SECT PINK 25CM LRG (MISCELLANEOUS) IMPLANT
STAPLER VISISTAT 35W (STAPLE) IMPLANT
STRIP CLOSURE SKIN 1/2X4 (GAUZE/BANDAGES/DRESSINGS) IMPLANT
SUT MON AB-0 CT1 36 (SUTURE) ×9 IMPLANT
SUT PLAIN 0 NONE (SUTURE) IMPLANT
SUT PLAIN 2 0 (SUTURE)
SUT PLAIN ABS 2-0 CT1 27XMFL (SUTURE) IMPLANT
SUT VIC AB 0 CT1 27 (SUTURE) ×6
SUT VIC AB 0 CT1 27XBRD ANBCTR (SUTURE) ×2 IMPLANT
SUT VIC AB 2-0 CT1 27 (SUTURE) ×6
SUT VIC AB 2-0 CT1 TAPERPNT 27 (SUTURE) ×2 IMPLANT
SUT VIC AB 4-0 KS 27 (SUTURE) IMPLANT
SUT VICRYL 0 TIES 12 18 (SUTURE) IMPLANT
TAPE CLOTH SURG 4X10 WHT LF (GAUZE/BANDAGES/DRESSINGS) ×3 IMPLANT
TOWEL OR 17X24 6PK STRL BLUE (TOWEL DISPOSABLE) ×3 IMPLANT
TRAY FOLEY CATH 14FR (SET/KITS/TRAYS/PACK) IMPLANT
WATER STERILE IRR 1000ML POUR (IV SOLUTION) ×3 IMPLANT

## 2014-09-19 NOTE — Anesthesia Postprocedure Evaluation (Signed)
  Anesthesia Post-op Note  Patient: Elizabeth [name]  Procedure(s) Performed: Procedure(s): CESAREAN SECTION (N/A)  Patient Location: PACU  Anesthesia Type:Epidural  Level of Consciousness: awake, alert  and oriented  Airway and Oxygen Therapy: Patient Spontanous Breathing  Post-op Pain: mild  Post-op Assessment: Post-op Vital signs reviewed, Patient's Cardiovascular Status Stable, Respiratory Function Stable, Patent Airway, No signs of Nausea or vomiting, Pain level controlled, No headache, No backache and No residual numbness  Post-op Vital Signs: Reviewed and stable  Last Vitals:  Filed Vitals:   09/19/14 0245  BP: 115/100  Pulse: 74  Temp:   Resp: 18    Complications: No apparent anesthesia complications

## 2014-09-19 NOTE — Addendum Note (Signed)
Addendum created 09/19/14 0816 by Billie Lade, CRNA   Modules edited: Notes Section   Notes Section:  File: [id number]

## 2014-09-19 NOTE — Anesthesia Postprocedure Evaluation (Signed)
  Anesthesia Post-op Note  Anesthesia Post Note  Patient: Elizabeth [name]  Procedure(s) Performed: Procedure(s) (LRB): CESAREAN SECTION (N/A)  Anesthesia type: Epidural  Patient location: Mother/Baby  Post pain: Pain level controlled  Post assessment: Post-op Vital signs reviewed  Last Vitals:  Filed Vitals:   09/19/14 0645  BP: 125/71  Pulse: 64  Temp: 36.7 C  Resp: 20    Post vital signs: Reviewed  Level of consciousness:alert  Complications: No apparent anesthesia complications

## 2014-09-19 NOTE — Op Note (Signed)
Cesarean Section Procedure Note Elizabeth [name]  09/19/2014  Indications: Arrest of decent at station 0   Pre-operative Diagnosis: Arrest of decent, AMA, Fibroid uterus, suspected LGA.   Post-operative Diagnosis: Same   Surgeon:  Elveria Royals, MD   Assistants: Artelia Laroche, CNM  Anesthesia: Epidural   Procedure Details:  The patient was seen in the Labor Room/ She was dilated to 10 cm and stayed at station 0 for 3 hrs with no change in station, ROT position. Cesarean section was recommended. The risks, benefits, complications, treatment options, and expected outcomes were discussed with the patient. The patient concurred with the proposed plan, giving informed consent. identified as Elizabeth [name] and the procedure verified as C-Section Delivery.  A Time Out was held and the above information confirmed. 2 gm Ancef given.  After induction of anesthesia, the patient was draped and prepped in the usual sterile manner. A Pfannenstiel Incision was made and carried down through the subcutaneous tissue to the fascia. Fascial incision was made and extended transversely. The fascia was separated from the underlying rectus tissue superiorly and inferiorly. The peritoneum was identified and entered. Peritoneal incision was extended longitudinally. Alexis-O retractor was placed. The utero-vesical peritoneal reflection was incised transversely and the bladder flap was bluntly freed from the lower uterine segment. Baby's head was elevated per vagina to upper pelvis and then a low transverse uterine incision was made. Delivered from cephalic ROT position was a vigorous FEMALE infant at 12.40 am on 09/19/14  with Apgar scores of 7 at one minute and 9 at five minutes. Cord ph was not sent the umbilical cord was clamped and cut cord blood was obtained for evaluation. The placenta was removed Intact and appeared normal. The uterine outline, tubes and ovaries appeared normal. RIGHT ovary noted a simple cyst, it was  incised, internal evaluation noted no excrescences or surface pathology and fluid was clear. The uterine incision was closed with running locked sutures of 0Vicryl followed by a second imbricating layer. Uterine atony noted, Methergine 0.2 mg IM given. Hemostasis was observed, uterus firmed up well. Alexis-O retractor was removed. Peritoneal closure done with 2-0 Vicryl. The fascia was then reapproximated with running sutures of 0Vicryl. The subcuticular closure was performed using 2-0plain gut. The skin was closed with 4-0Vicryl.   Instrument, sponge, and needle counts were correct prior the abdominal closure and were correct at the conclusion of the case.   Findings: Fibroid uterus noted. Female infant delivered from cephalic ROT position at 62.83 am on 09/19/14  with Apgar scores of 7 at one minute and 9 at five minutes. Weight pending. Normal tubes and left ovary, right ovary with simple cyst, no surface growth and no excrescences inside the cyst. 3 vessel cord, normal large placenta.   Estimated Blood Loss: 750 cc   Total IV Fluids: 2000 ml LR  Urine Output: 100CC OF clear urine  Specimens: cord blood  Complications: no complications  Disposition: PACU - hemodynamically stable.   Maternal Condition: stable   Baby condition / location:  Couplet care / Skin to Skin  Attending Attestation: I performed the procedure.   Signed: Surgeon(s): Elveria Royals, MD

## 2014-09-19 NOTE — Transfer of Care (Signed)
Immediate Anesthesia Transfer of Care Note  Patient: Elizabeth [name]  Procedure(s) Performed: Procedure(s): CESAREAN SECTION (N/A)  Patient Location: PACU  Anesthesia Type:Epidural  Level of Consciousness: awake, alert , oriented and patient cooperative  Airway & Oxygen Therapy: Patient Spontanous Breathing  Post-op Assessment: Report given to PACU RN, Post -op Vital signs reviewed and stable and Patient moving all extremities X 4  Post vital signs: Reviewed and stable  Complications: No apparent anesthesia complications

## 2014-09-19 NOTE — Progress Notes (Signed)
POD # 0-Interval note  Subjective: Pt reports feeling well/ Pain controlled with long acting narcotic Tolerating po/ Foley in place Activity: up with assistance Bleeding is light Newborn info:  Information for the patient's newborn:  Kailin, Principato [[id number]]  female Feeding: breast   Objective:  VS:  Filed Vitals:   09/19/14 0920 09/19/14 1020 09/19/14 1025 09/19/14 1030  BP: 112/70 98/59 105/59 106/67  Pulse: 68 71 73 71  Temp: 98.6 F (37 C)     TempSrc:      Resp: 18     Height:      Weight:      SpO2: 98%        I&O: Intake/Output     10/04 0701 - 10/05 0700 10/05 0701 - 10/06 0700   P.O.  300   I.V. (mL/kg) 2302 (32.1) 220 (3.1)   Total Intake(mL/kg) 2302 (32.1) 520 (7.3)   Urine (mL/kg/hr) 1955 (1.1) 50 (0.1)   Blood 750 (0.4)    Total Output 2705 50   Net -403 +470           Recent Labs  09/18/14 0740 09/19/14 0615  WBC 6.6 15.5*  HGB 12.3 12.1  HCT 34.9* 35.0*  PLT 210 193    Blood type: --/--/O POS, O POS (10/04 0740) Rubella: Immune (03/20 0000)    Physical Exam:  General: alert and cooperative  Assessment: POD # 0/ G1P1001/ S/P C/Section d/t arrest of descent Doing well  Plan: OOB with assistance Continue routine post op orders   Signed: Julianne Handler, N, MSN, CNM 09/19/2014, 11:58 AM

## 2014-09-20 ENCOUNTER — Encounter (HOSPITAL_COMMUNITY): Payer: Self-pay | Admitting: Obstetrics & Gynecology

## 2014-09-20 NOTE — Progress Notes (Signed)
Addendum note from earlier  S:         Reports feeling well - slept today             Tolerating po intake / no nausea / no vomiting              Bleeding is light             Pain controlled with motrin and percocet             Up ad lib / ambulatory/ voiding QS  Newborn breast feeding    O:  VS: BP 101/50  Pulse 68  Temp(Src) 98.6 F (37 C) (Oral)  Resp 18  Ht 5\' 1"  (1.549 m)  Wt 71.668 kg (158 lb)  BMI 29.87 kg/m2  SpO2 99%  Breastfeeding? Unknown   LABS:               Recent Labs  09/18/14 0740 09/19/14 0615  WBC 6.6 15.5*  HGB 12.3 12.1  PLT 210 193               Bloodtype: --/--/O POS, O POS (10/04 0740)  Rubella: Immune (03/20 0000)                                             Physical Exam:             Alert and Oriented X3  Abdomen: soft, non-tender, non-distended              Fundus: firm, non-tender, U-1             Dressing intact honeycomb - residual pressure dressing on hips ( instructed how to remove)              Incision:  approximated with suture and steri-strips / no erythema / no ecchymosis / no drainage  Perineum: no edema  Lochia: light  Extremities: no edema, no calf pain or tenderness  A:        POD # 1 S/P CS           P:        Routine postoperative care       Artelia Laroche CNM, MSN, [facility] 09/20/2014, 8:49 PM

## 2014-09-20 NOTE — Progress Notes (Signed)
Patient ID: Elizabeth [name], female   DOB: 04/22/1971, 43 y.o.   MRN: [id number]  POSTOPERATIVE DAY # 1 S/P CS  O:  VS: BP 106/48  Pulse 60  Temp(Src) 98.8 F (37.1 C) (Oral)  Resp 18  Ht 5\' 1"  (1.549 m)  Wt 71.668 kg (158 lb)  BMI 29.87 kg/m2  SpO2 99%  Breastfeeding? Unknown  LABS:               Recent Labs  09/18/14 0740 09/19/14 0615  WBC 6.6 15.5*  HGB 12.3 12.1  PLT 210 193               Bloodtype: --/--/O POS, O POS (10/04 0740)  Rubella: Immune (03/20 0000)                                            A:        POD # 1 S/P CS             P:        Routine postoperative care              Return for PE - patient sleeping at visit   Artelia Laroche CNM, MSN, [facility] 09/20/2014, 10:55 AM

## 2014-09-20 NOTE — Lactation Note (Signed)
This note was copied from the chart of [name]. Lactation Consultation Note  Patient Name: [name] [id number] Date: 09/20/2014 Reason for consult: Follow-up assessment;Difficult latch;Breast/nipple pain Mom reports baby is latching to left breast but will not sustain latch on the right breast. She has been trying to hand express the right breast for stimulation. She has been using the hand pump intermittently. Mom reports the left nipple is sore, no breakdown noted. LC does note Mom has flat nipples with dimpling center of nipple. LC notes when baby cries, baby has a very short, posterior frenulum with deep dimpling (heart shape) resulting in limited upward and side to side mobility of the tongue. The upper labial frenulum is thick and down to gum line. Baby does not extend the tongue past the gum line. When latching baby cannot obtain good depth, has difficulty keeping upper/lower lip well flanged. Initiated #20 nipple shield on right breast and after few attempts baby was able to sustain the latch. Bottom lip needed to be adjusted few times. Baby nursed off/on for 15 minutes, scant amount of colostrum visible in nipple shield. Mom thought baby was latching well to left breast however with observing this feeding baby was not obtaining good depth. Advised Mom to use nipple shield for left breast as well. Initiated Mom pumping to encourage milk production while she continues to work on latch. Advised to pump on Preemie setting for 15 minutes. If she receives any EBM, advised to give back to baby. Discussed with Mom to look for milk in the nipple shield with feedings. Advised Mom to discuss frenulum with Peds if she is interested in consult for revision. Advised Mom to call LC to observe another feeding this evening to determine milk transfer and decide if supplementation will be necessary.   Maternal Data    Feeding Feeding Type: Breast Fed Length of feed: 5 min (off/on)  LATCH  Score/Interventions Latch: Repeated attempts needed to sustain latch, nipple held in mouth throughout feeding, stimulation needed to elicit sucking reflex. (with or without nipple shield) Intervention(s): Adjust position;Assist with latch;Breast massage;Breast compression  Audible Swallowing: None (scant amount of colostrum w/nursing on right breast) Intervention(s): Hand expression  Type of Nipple: Flat Intervention(s): Hand pump  Comfort (Breast/Nipple): Filling, red/small blisters or bruises, mild/mod discomfort (dimpled center of nipple)  Problem noted: Mild/Moderate discomfort (left nipple/breast, no breakdown noted) Interventions  (Cracked/bleeding/bruising/blister): Expressed breast milk to nipple Interventions (Mild/moderate discomfort): Comfort gels  Hold (Positioning): Assistance needed to correctly position infant at breast and maintain latch. Intervention(s): Breastfeeding basics reviewed;Support Pillows;Position options;Skin to skin  LATCH Score: 4  Lactation Tools Discussed/Used Tools: Nipple Shields Nipple shield size: 20 Breast pump type: Double-Electric Breast Pump Pump Review: Setup, frequency, and cleaning Initiated by::  (KG) Date initiated:: 09/20/14   Consult Status Consult Status: Follow-up Date: 09/20/14 Follow-up type: In-patient    Katrine Coho 09/20/2014, 4:55 PM

## 2014-09-21 MED ORDER — HYDROCORTISONE 1 % EX CREA
TOPICAL_CREAM | CUTANEOUS | Status: DC | PRN
  Filled 2014-09-21: qty 28

## 2014-09-21 MED ORDER — HYDROCORTISONE 0.5 % EX CREA
TOPICAL_CREAM | CUTANEOUS | Status: DC | PRN
  Filled 2014-09-21: qty 28.35

## 2014-09-21 NOTE — Progress Notes (Signed)
Patient ID: Elizabeth [name], female   DOB: 01/04/1971, 43 y.o.   MRN: [id number] Subjective:  POD# 2 Information for the patient's newborn:  Elizabeth, [name] [[id number]]  female  Reports feeling well Feeding: breast Patient reports tolerating PO.  Breast symptoms: difficulty latching without pumping first Pain controlled with ibuprofen (OTC) and narcotic analgesics including Percocet Denies HA/SOB/C/P/N/V/dizziness. Flatus present - abdominal distension from increased gas. No BM. She reports vaginal bleeding as normal, without clots.  She is ambulating, urinating without difficult.     Objective:   VS:  Filed Vitals:   09/20/14 0030 09/20/14 0711 09/20/14 1700 09/21/14 0548  BP: 100/46 106/48 101/50 101/64  Pulse: 78 60 68 62  Temp: 98 F (36.7 C) 98.8 F (37.1 C) 98.6 F (37 C) 98.1 F (36.7 C)  TempSrc: Oral Oral Oral Oral  Resp: 18 18 18 18   Height:      Weight:      SpO2: 99%       No intake or output data in the 24 hours ending 09/21/14 1149      Recent Labs  09/19/14 0615  WBC 15.5*  HGB 12.1  HCT 35.0*  PLT 193     Blood type: --/--/O POS, O POS (10/04 0740)  Rubella: Immune (03/20 0000)     Physical Exam:  General: alert, cooperative and no distress Abdomen: soft, nontender, normal bowel sounds Incision: Tegaderm and Honeycomb intact, but >75% wet honeycomb - no drainage Uterine Fundus: firm, 1 FB below umbilicus, nontender Lochia: minimal Ext: extremities normal, atraumatic, no cyanosis or edema and Homans sign is negative, no sign of DVT   Assessment/Plan: 43 y.o.   POD# 2.  s/p Cesarean Delivery.  Indications: arrest of descent                Principal Problem:   Postpartum care following cesarean delivery (10/5) Active Problems:   Labor and delivery, indication for care   Cesarean delivery delivered (10/5)  Doing well, stable.               Regular diet as tolerated Increase ambulation  Drink warm drinks to facilitate passing of  flatus Change Tegaderm/Honeycomb dressing Routine post-op care  Graceann Congress, MSN, CNM 09/21/2014, 11:49 AM

## 2014-09-22 MED ORDER — IBUPROFEN 600 MG PO TABS: 600.0000 mg | ORAL_TABLET | Freq: Four times a day (QID) | ORAL | Status: AC

## 2014-09-22 MED ORDER — OXYCODONE-ACETAMINOPHEN 5-325 MG PO TABS: 1.0000 | ORAL_TABLET | ORAL | Status: DC | PRN

## 2014-09-22 NOTE — Lactation Note (Signed)
This note was copied from the chart of Elizabeth Nyliah Nierenberg. Lactation Consultation Note Baby had 9% wt. Loss. Mom BF at small intervals then bottle feeding formula. Wt. Could contribute to having 9 pees, and 9 poops. Mom is Breast/bottle until her "milk" come in. Had adequate I&O for hours of age. Patient Name: Elizabeth [name] Date: 09/22/2014 Reason for consult: Follow-up assessment   Maternal Data    Feeding    LATCH Score/Interventions                      Lactation Tools Discussed/Used     Consult Status Consult Status: Follow-up Date: 09/22/14 Follow-up type: In-patient    Xerxes Agrusa, Elta Guadeloupe 09/22/2014, 5:17 AM

## 2014-09-22 NOTE — Lactation Note (Signed)
This note was copied from the chart of Elizabeth Fritzie Prioleau. Lactation Consultation Note  Patient Name: Elizabeth [name] [id number] Date: 09/22/2014 Reason for consult: Follow-up assessment;Difficult latch  Visited with Elizabeth [name] and baby on day of discharge, baby at 65 hrs old.  Elizabeth [name] all packed up ready to leave.  Baby sleeping quietly in crib.  Talked about her plan on discharge, and whether she had any questions I could help her with.  Elizabeth [name] has a pump coming from her insurance company in the next 5-7 days.  Talked about the need for a pump as long as baby is having difficulty latching and requiring supplement. Baby hadn't been fed in 3 hrs, so offered to help her with latching baby.  Elizabeth [name] agreed and seemed eager for [facility] assistance.  Elizabeth [name] positioned herself to use football hold on left breast.  Recommended she use the manual breast pump to help pull her nipple out and help establish the milk flow.  Breasts are soft, but heavy.  Colostrum easily expressed with manual pumping.  Baby has a small mouth, with slight receding of lower jaw noted, but with finger, she sucks very vigorously.  Palate slightly high, and tongue retracted some during finger assessment.  Tongue does extend past lower gum line. (Elizabeth [name] stated that Pediatrician does not think the frenulum was an issue).  Baby placed near breast, and does immediately open her mouth very widely.  Elizabeth [name] denied feeling any pinching, and baby using regular jaw compression during the feeding.  Encouraged breast support and gentle alternating breast compression during the feeding.  Baby taken off and nipple not pinched but pulled out and rounded.  Fed for 15 minutes, with occasional swallows heard.  Baby does need stimulation to continue to feed.  On right side, manual pump used to pull nipple out, but baby unable to attain a deep areolar grasp.  Elizabeth [name] complained of pinching, and nipple noted to be pinched at tip when baby removed.  Initiated the 20 mm nipple shield, and baby was able  to extend her lower jaw much better.  Instilled some formula via curved tip syringe to help initiate nutritive suckling.  Elizabeth [name] feeling a tug as baby was nursing.  Elizabeth [name] to borrow a Medela PIS from a friend (offered [employer] pump rental until her insurance pump arrives)   Plan given and reviewed with Elizabeth [name].  Supplement increased today to 20-30 ml per feeding.  Outpatient appointment made for Monday, 10/12 @ 2:30pm.  Consult Status Consult Status: Follow-up Date: 09/26/14 Follow-up type: [place] 09/22/2014, 11:19 AM

## 2014-09-22 NOTE — Discharge Summary (Signed)
POSTOPERATIVE DISCHARGE SUMMARY:  Patient ID: Elizabeth [name] MRN: [id number] DOB/AGE: [birth year] 43 y.o.  Admit date: 09/18/2014 Admission Diagnoses: [redacted] weeks gestation, LGA, AMA   Discharge date: 09/22/2014 Discharge Diagnoses: S/P C/S on 09/19/14        Prenatal history: G1P1001   EDC : 09/25/2014, by Other Basis  Prenatal care at [facility] since [redacted] wks gestation. Primary provider : Dr. Benjie [name] Prenatal course complicated by YQM>57, LGA, fibroids, and anemia.  Prenatal labs: ABO, Rh: --/--/O POS, O POS (10/04 0740)  Antibody: NEG (10/04 0740) Rubella:   IMMUNE RPR: NON REAC (10/04 0740)  HBsAg: Negative (03/20 0000)  HIV: Non-reactive (03/20 0000)  GBS: Negative (09/02 0000)  GTT: 129  Medical / Surgical History :  Past medical history:  Past Medical History  Diagnosis Date  . Anemia   . History of ovarian cyst   . AMA (advanced maternal age) multigravida 43+   . Fibroid   . Female stress incontinence   . Anxiety     Past surgical history:  Past Surgical History  Procedure Laterality Date  . No past surgeries    . Cesarean section N/A 09/19/2014    Procedure: CESAREAN SECTION;  Surgeon: Elizabeth Royals, MD;  Location: [facility];  Service: Obstetrics;  Laterality: N/A;     Medications on Admission: No prescriptions prior to admission    Allergies: Penicillins   Intrapartum Course:  Admitted for IOL, Pitocin, AROM, epidural, arrest of descent in second stage, primary CS.  Postpartum Course: Uncomplicated   Physical Exam:   VSS: Blood pressure 95/75, pulse 75, temperature 98.1 F (36.7 C), temperature source Oral, resp. rate 17, height 5\' 1"  (1.549 m), weight 71.668 kg (158 lb), SpO2 97.00%, unknown if currently breastfeeding.  LABS: No results found for this basename: WBC, HGB, PLT,  in the last 72 hours  General: Alert and oriented x3 Heart: RRR Lungs: CTA bilaterally GI: soft, non-tender, non-distended, BS x4 Lochia: small Uterus:  firm below umbilicus Incision: well approximated; honeycomb dressing-no significant erythema, drainage, or edema Extremities: no edema, Homans neg   Newborn Data Live born female  Birth Weight: 8 lb 8.2 oz (3860 g) APGAR: 7, 9  See operative report for further details  Home with mother.  Discharge Instructions:  Wound Care: keep clean and dry / remove honeycomb POD 6 Postpartum Instructions: [facility] discharge booklet - instructions reviewed Medications:    Medication List    STOP taking these medications       calcium carbonate 500 MG chewable tablet  Commonly known as:  TUMS - dosed in mg elemental calcium      TAKE these medications       diphenhydrAMINE-zinc acetate cream  Commonly known as:  BENADRYL  Apply 1 application topically 3 (three) times daily as needed for itching.     ibuprofen 600 MG tablet  Commonly known as:  ADVIL,MOTRIN  Take 1 tablet (600 mg total) by mouth every 6 (six) hours.     oxyCODONE-acetaminophen 5-325 MG per tablet  Commonly known as:  PERCOCET/ROXICET  Take 1-2 tablets by mouth every 4 (four) hours as needed (for pain scale equal to or greater than 7).     prenatal multivitamin Tabs tablet  Take 1 tablet by mouth daily at 12 noon.            Follow-up Information   Follow up with [name],[name], MD. Schedule an appointment as soon as possible for a visit in 6 weeks.  Specialty:  Obstetrics and Gynecology   Contact information:   [place] [id number] [phone]         Signed: Julianne [name], Delane Ginger MSN, CNM 09/22/2014, 1:34 PM

## 2014-09-22 NOTE — Progress Notes (Signed)
POD # 3  Subjective: Pt reports feeling well/ Pain controlled with Motrin and Percocet Tolerating po/Voiding without problems/ No n/v/ Flatus present Activity: ad lib Bleeding is light Newborn info:  Information for the patient's newborn:  Samariah, Hokenson [[id number]]  female Feeding: breast and bottle   Objective: VS: VS:  Filed Vitals:   09/20/14 1700 09/21/14 0548 09/21/14 1655 09/22/14 0521  BP: 101/50 101/64 131/48 95/75  Pulse: 68 62 73 75  Temp: 98.6 F (37 C) 98.1 F (36.7 C) 98.4 F (36.9 C) 98.1 F (36.7 C)  TempSrc: Oral Oral Oral Oral  Resp: 18 18 18 17   Height:      Weight:      SpO2:    97%                           Physical Exam:  General: alert and cooperative CV: Regular rate and rhythm Resp: CTA bilaterally Abdomen: soft, nontender, normal bowel sounds Incision: healing well, no drainage, no erythema, no hernia, no seroma, no swelling, well approximated, honeycomb dsg c/d/i Uterine Fundus: firm, below umbilicus, nontender Lochia: minimal Ext: extremities normal, atraumatic, no cyanosis or edema and Homans sign is negative, no sign of DVT    Assessment: POD # 3/ G1P1001/ S/P C/Section d/t arrest of descent Doing well and stable for discharge home  Plan: Discharge home RX's: Ibuprofen 600mg  po Q 6 hrs prn pain #30 Refill x 1 Percocet 5/325 1 - 2 tabs po every 4 hrs prn pain #30 Refill x 0 [facility] booklet given    Signed: Julianne Handler, Delane Ginger, MSN, CNM 09/22/2014, 9:10 AM

## 2014-09-26 ENCOUNTER — Encounter (HOSPITAL_COMMUNITY): Payer: Self-pay | Admitting: Emergency Medicine

## 2014-09-26 ENCOUNTER — Ambulatory Visit (HOSPITAL_COMMUNITY): Payer: BC Managed Care – PPO

## 2014-09-26 ENCOUNTER — Emergency Department (HOSPITAL_COMMUNITY)
Admission: EM | Admit: 2014-09-26 | Discharge: 2014-09-26 | Disposition: A | Discharge status: Home / Self Care | Payer: BC Managed Care – PPO | Attending: Emergency Medicine | Admitting: Emergency Medicine

## 2014-09-26 DIAGNOSIS — G510 Bell's palsy: Secondary | ICD-10-CM

## 2014-09-26 DIAGNOSIS — Z79899 Other long term (current) drug therapy: Secondary | ICD-10-CM | POA: Insufficient documentation

## 2014-09-26 DIAGNOSIS — D649 Anemia, unspecified: Secondary | ICD-10-CM | POA: Insufficient documentation

## 2014-09-26 DIAGNOSIS — M542 Cervicalgia: Secondary | ICD-10-CM | POA: Diagnosis present

## 2014-09-26 DIAGNOSIS — Z8659 Personal history of other mental and behavioral disorders: Secondary | ICD-10-CM | POA: Insufficient documentation

## 2014-09-26 DIAGNOSIS — Z880 Allergy status to penicillin: Secondary | ICD-10-CM | POA: Insufficient documentation

## 2014-09-26 DIAGNOSIS — Z8742 Personal history of other diseases of the female genital tract: Secondary | ICD-10-CM | POA: Diagnosis not present

## 2014-09-26 MED ORDER — PREDNISONE 20 MG PO TABS: 60.0000 mg | ORAL_TABLET | Freq: Every day | ORAL | Status: DC

## 2014-09-26 NOTE — ED Notes (Signed)
Pt reports recently delivered a baby at [facility]. Pt reports this weekend experienced left sided facial numbness and weakness and stiff neck. Pt also reports rash since being in the hospital.

## 2014-09-26 NOTE — ED Provider Notes (Signed)
CSN: [id number]     Arrival date & time 09/26/14  1638 History   First MD Initiated Contact with Patient 09/26/14 0911     Chief Complaint  Patient presents with  . Rash  . Numbness  . Weakness  . Neck Pain     (Consider location/radiation/quality/duration/timing/severity/associated sxs/prior Treatment) HPI Comments: Patient presents today with a chief complaint of left sided facial numbness, inability to close the left eye, and left facial droop.  She reports that she began having symptoms yesterday. Symptoms gradually worsening.  She reports that she has never had these symptoms before. She denies weakness of upper or lower extremities.   Denies numbness or tingling of upper or lower extremities.  Denies difficulty swallowing or speaking.  She does report an associated earache and also blurred vision of the left eye.  She denies fever, chills, chest pain, or SOB.   Patient also complaining of a rash to the arms, legs, and back. Rash has been there for the past 6 days.  She reports that she recently had a baby at [facility] one week ago and that the rash came one day after having the baby.  She reports that she notified staff at [facility] and was told to take Benadryl and apply Cortisone cream to the area.   She reports that the rash does itch, but is not painful.  She was on Percocet, but stopped taking that 2 days ago.   She denies any other new medications. Denies new soaps, detergents, or lotions.  The history is provided by the patient.    Past Medical History  Diagnosis Date  . Anemia   . History of ovarian cyst   . AMA (advanced maternal age) multigravida 80+   . Fibroid   . Female stress incontinence   . Anxiety    Past Surgical History  Procedure Laterality Date  . No past surgeries    . Cesarean section N/A 09/19/2014    Procedure: CESAREAN SECTION;  Surgeon: Elveria Royals, MD;  Location: [facility];  Service: Obstetrics;  Laterality: N/A;   Family History   Problem Relation Age of Onset  . Colon cancer Father   . Cancer Father     stomach  . Hypertension Father    History  Substance Use Topics  . Smoking status: Never Smoker   . Smokeless tobacco: Never Used  . Alcohol Use: No   OB History   Grav Para Term Preterm Abortions TAB SAB Ect Mult Living   1 1 1       1      Review of Systems  All other systems reviewed and are negative.     Allergies  Penicillins  Home Medications   Prior to Admission medications   Medication Sig Start Date End Date Taking? Authorizing Provider  diphenhydrAMINE-zinc acetate (BENADRYL) cream Apply 1 application topically 3 (three) times daily as needed for itching.    Historical Provider, MD  ibuprofen (ADVIL,MOTRIN) 600 MG tablet Take 1 tablet (600 mg total) by mouth every 6 (six) hours. 09/22/14   Graciela Husbands, CNM  oxyCODONE-acetaminophen (PERCOCET/ROXICET) 5-325 MG per tablet Take 1-2 tablets by mouth every 4 (four) hours as needed (for pain scale equal to or greater than 7). 09/22/14   Graciela Husbands, CNM  Prenatal Vit-Fe Fumarate-FA (PRENATAL MULTIVITAMIN) TABS tablet Take 1 tablet by mouth daily at 12 noon.    Historical Provider, MD   BP 129/78  Pulse 94  Temp(Src) 97.8 F (36.6  C) (Oral)  Resp 12  Ht 5\' 1"  (1.549 m)  Wt 130 lb (58.968 kg)  BMI 24.58 kg/m2  SpO2 100% Physical Exam  Nursing note and vitals reviewed. Constitutional: She appears well-developed and well-nourished.  HENT:  Head: Normocephalic and atraumatic.  Mouth/Throat: Oropharynx is clear and moist.  Eyes: EOM are normal. Pupils are equal, round, and reactive to light.  Patient unable to fully close her left eye  Neck: Normal range of motion. Neck supple.  Cardiovascular: Normal rate, regular rhythm and normal heart sounds.   Pulmonary/Chest: Effort normal and breath sounds normal.  Neurological: She is alert. Gait normal.  Patient unable to raise her left eyebrow Decreased sensation of the left  face Smile asymmetric with droop on the left Muscle strength 5/5 upper and lower extremities bilaterally Normal finger to nose testing bilaterally  Skin: Skin is warm and dry. Rash noted.  Patient with a diffuse blanchable erythematous pruritic rash located on both legs, bilateral lower back, and both arms.    ED Course  Procedures (including critical care time) Labs Review Labs Reviewed - No data to display  Imaging Review No results found.   EKG Interpretation None      MDM   Final diagnoses:  None   Patient presenting with left facial numbness and inability to close the left eye onset yesterday.  Aside from the face no other neurological deficits.  Forehead is involved.  Therefore, presentation not consistent with CVA.  Likely Bell's Palsy.  Patient started on Prednisone.  Patient also presents with a rash that has been present for the past week. She is afebrile. No swelling of the lips, tongue, or throat. Rash is blanchable and pruritic. No oral lesions.  Does not appear to be anything emergent at this time.  Patient put on the Prednisone for Bell's Palsy, which may also help with the rash.  Patient stable for discharge.  Return precautions given.    Hyman Bible, PA-C 09/27/14 2131

## 2014-09-26 NOTE — ED Notes (Signed)
MD at bedside. 

## 2014-09-28 ENCOUNTER — Ambulatory Visit (HOSPITAL_COMMUNITY): Admission: RE | Admit: 2014-09-28 | Payer: BC Managed Care – PPO | Source: Ambulatory Visit

## 2014-09-28 NOTE — ED Provider Notes (Signed)
Medical screening examination/treatment/procedure(s) were conducted as a shared visit with non-physician practitioner(s) and myself.  I personally evaluated the patient during the encounter.   EKG Interpretation   Date/Time:  Monday September 26 2014 09:04:56 EDT Ventricular Rate:  92 PR Interval:  144 QRS Duration: 68 QT Interval:  336 QTC Calculation: 415 R Axis:   -22 Text Interpretation:  Normal sinus rhythm No previous tracing Confirmed by  Ashok Cordia  MD, Lennette Bihari ([id number]) on 09/26/2014 11:08:54 AM     Pt c/o left sided facial weakness.  On exam, left facial palsy that involves forehead. No extremity numbness/weakness. No problems w balance, coordination or gait. No headache. No fever. No ear pain or rash. Exam c/w bells palsy.   Mirna Mires, MD 09/28/14 [phone]

## 2014-10-03 ENCOUNTER — Ambulatory Visit (HOSPITAL_COMMUNITY): Admission: RE | Admit: 2014-10-03 | Payer: BC Managed Care – PPO | Source: Ambulatory Visit

## 2014-10-17 ENCOUNTER — Encounter (HOSPITAL_COMMUNITY): Payer: Self-pay | Admitting: Emergency Medicine

## 2015-09-28 ENCOUNTER — Other Ambulatory Visit: Payer: Self-pay | Admitting: Family Medicine

## 2015-09-28 ENCOUNTER — Ambulatory Visit
Admission: RE | Admit: 2015-09-28 | Discharge: 2015-09-28 | Disposition: A | Discharge status: Home / Self Care | Payer: BC Managed Care – PPO | Source: Ambulatory Visit | Attending: Family Medicine | Admitting: Family Medicine

## 2015-09-28 DIAGNOSIS — R05 Cough: Secondary | ICD-10-CM

## 2015-09-28 DIAGNOSIS — R059 Cough, unspecified: Secondary | ICD-10-CM

## 2016-12-15 ENCOUNTER — Emergency Department (HOSPITAL_COMMUNITY)
Admission: EM | Admit: 2016-12-15 | Discharge: 2016-12-15 | Disposition: A | Discharge status: Home / Self Care | Payer: BC Managed Care – PPO | Attending: Emergency Medicine | Admitting: Emergency Medicine

## 2016-12-15 ENCOUNTER — Encounter (HOSPITAL_COMMUNITY): Payer: Self-pay | Admitting: Emergency Medicine

## 2016-12-15 DIAGNOSIS — H5711 Ocular pain, right eye: Secondary | ICD-10-CM | POA: Diagnosis present

## 2016-12-15 DIAGNOSIS — Z7982 Long term (current) use of aspirin: Secondary | ICD-10-CM | POA: Diagnosis not present

## 2016-12-15 DIAGNOSIS — H1031 Unspecified acute conjunctivitis, right eye: Secondary | ICD-10-CM | POA: Diagnosis not present

## 2016-12-15 MED ORDER — GATIFLOXACIN 0.5 % OP SOLN: OPHTHALMIC | 0 refills | Status: DC

## 2016-12-15 MED ORDER — TETRACAINE HCL 0.5 % OP SOLN
2.0000 [drp] | Freq: Once | OPHTHALMIC | Status: AC
  Administered 2016-12-15: 2 [drp] via OPHTHALMIC
  Filled 2016-12-15: qty 2

## 2016-12-15 MED ORDER — GATIFLOXACIN 0.5 % OP SOLN
1.0000 [drp] | OPHTHALMIC | Status: DC
  Filled 2016-12-15: qty 2.5

## 2016-12-15 MED ORDER — FLUORESCEIN SODIUM 0.6 MG OP STRP
1.0000 | ORAL_STRIP | Freq: Once | OPHTHALMIC | Status: AC
  Administered 2016-12-15: 1 via OPHTHALMIC
  Filled 2016-12-15: qty 1

## 2016-12-15 NOTE — ED Notes (Signed)
ED Provider at bedside. 

## 2016-12-15 NOTE — Discharge Instructions (Signed)
Read the information below.  Your have inflammation of your right eye from the recent chemical exposure. No obvious abrasions were visualized. Your eye was irrigated. You are being prescribed antibiotic drops, please apply 1 drop every 2 hrs while awake (do not exceed 4 doses) for the first 2 days. Following apply 1 drop every 6hrs for 5 days.  The contact lens may remove on its own in a few days or may need to be removed by your eye doctor.  Please call your eye doctor in the morning to schedule a follow up appointment.  Use the prescribed medication as directed.  Please discuss all new medications with your pharmacist.   You may return to the Emergency Department at any time for worsening condition or any new symptoms that concern you. Return if you develop fever, headache, nausea, vomiting, unable to move eye, purulent drainage, or any other new/concerning symptoms.

## 2016-12-15 NOTE — ED Triage Notes (Signed)
Pt reports putting two contact lenses in the same eye by accident, states she tried to remove it with eye drops but used ear wax removal drops instead. R eye red and irritated. States that she thinks the ear wax got one of the lenses stuck.

## 2016-12-15 NOTE — ED Provider Notes (Signed)
[facility] DEPT Provider Note   CSN: [id number] Arrival date & time: 12/15/16  2042  By signing my name below, I, Elizabeth [name], attest that this documentation has been prepared under the direction and in the presence of Gay Filler, PA-C. Electronically Signed: Norris [name] , ED Scribe. 12/16/16. 12:10 AM.    History   Chief Complaint Chief Complaint  Patient presents with  . Eye Pain    HPI  HPI Comments:  Elizabeth [name] is a 45 y.o. female who presents to the Emergency Department complaining of foreign body in the R eye onset x3 hours ago. Pt states that while putting contacts in around 6:30PM, she accidentally put 2 contacts in the R eye. She reportedly removed one of the contacts but could not remove the other. She states that she went to put in eye drops to help with removing the second contact, but instead accidentally put in OTC ear wax removal medication -  6.5 mg carbomide peroxide. She promptly irrigated her eye with water and came to the ED. She complaints of eye redness and foreign body sensation. She states states that it feels like the lens is behind the eye. Pt denies eye itching, photophobia, blurred vision, double vision, eye discharge, eye pain with movement, headache, nausea and vomiting, or any other symptoms. She does have an eye doctor.    The history is provided by the patient. No language interpreter was used.    Past Medical History:  Diagnosis Date  . AMA (advanced maternal age) multigravida 20+   . Anemia   . Anxiety   . Female stress incontinence   . Fibroid   . History of ovarian cyst     Patient Active Problem List   Diagnosis Date Noted  . Cesarean delivery delivered (10/5) 09/19/2014  . Postpartum care following cesarean delivery (10/5) 09/19/2014  . Labor and delivery, indication for care 09/18/2014    Past Surgical History:  Procedure Laterality Date  . CESAREAN SECTION N/A 09/19/2014   Procedure: CESAREAN SECTION;  Surgeon:  Elveria Royals, MD;  Location: [facility];  Service: Obstetrics;  Laterality: N/A;  . NO PAST SURGERIES      OB History    Gravida Para Term Preterm AB Living   1 1 1     1    SAB TAB Ectopic Multiple Live Births           1       Home Medications    Prior to Admission medications   Medication Sig Start Date End Date Taking? Authorizing Provider  aspirin EC 81 MG tablet Take 81 mg by mouth once.    Historical Provider, MD  diphenhydrAMINE-zinc acetate (BENADRYL) cream Apply 1 application topically 3 (three) times daily as needed for itching.    Historical Provider, MD  gatifloxacin (ZYMAXID) 0.5 % SOLN 1 drop every 2 hours (do not exceed 4 doses) while awake for first 2 days. Following one drop every 6hrs for 5 days 12/15/16   Roxanna Mew, PA-C  ibuprofen (ADVIL,MOTRIN) 600 MG tablet Take 1 tablet (600 mg total) by mouth every 6 (six) hours. 09/22/14   Julianne Handler, CNM  oxyCODONE-acetaminophen (PERCOCET/ROXICET) 5-325 MG per tablet Take 1-2 tablets by mouth every 4 (four) hours as needed (for pain scale equal to or greater than 7). 09/22/14   Julianne Handler, CNM  predniSONE (DELTASONE) 20 MG tablet Take 3 tablets (60 mg total) by mouth daily with breakfast. Take 3 tablets daily x 5 days.  After 5 days then take 2 tablets daily for 5 days. 09/26/14   Hyman Bible, PA-C  Prenatal Vit-Fe Fumarate-FA (PRENATAL MULTIVITAMIN) TABS tablet Take 1 tablet by mouth daily at 12 noon.    Historical Provider, MD    Family History Family History  Problem Relation Age of Onset  . Colon cancer Father   . Cancer Father     stomach  . Hypertension Father     Social History Social History  Substance Use Topics  . Smoking status: Never Smoker  . Smokeless tobacco: Never Used  . Alcohol use No     Allergies   Penicillins   Review of Systems Review of Systems  Constitutional: Negative for chills and fever.  Eyes: Positive for redness. Negative for photophobia, pain, discharge,  itching and visual disturbance.  Gastrointestinal: Negative for nausea and vomiting.  Neurological: Negative for headaches.     Physical Exam Updated Vital Signs BP 118/82 (BP Location: Left Arm)   Pulse 86   Temp 97.6 F (36.4 C) (Oral)   Resp 16   Ht 5\' 1"  (1.549 m)   Wt 129 lb (58.5 kg)   LMP 10/16/2016 (LMP Unknown)   SpO2 100%   BMI 24.37 kg/m   Physical Exam  Constitutional: She appears well-developed and well-nourished. No distress.  HENT:  Head: Normocephalic and atraumatic.  Eyes: Conjunctivae and EOM are normal. Pupils are equal, round, and reactive to light. Lids are everted and swept, no foreign bodies found. Right eye exhibits no discharge. Left eye exhibits no discharge. No scleral icterus.  Slit lamp exam:      The right eye shows no corneal ulcer, no hyphema and no hypopyon.       The left eye shows no corneal ulcer, no hyphema and no hypopyon.  R eye is red. No periorbital swelling or erythema. PERRL. EOMI. Visual acuity is 20/30 OD and 20/20 Os. Eye is soft. Lids everted and swept no obvious foreign body or visualization of contact lens. pH is 7-8 following irrigation. IOP OD is 16, OS is 18. On fluorescein no obvious foreign body, corneal abrasions/ulcerations, or dendritic lesions. No seidel sign. Anterior chamber negative for hyphema or hypopyon.   Neck: Normal range of motion. Neck supple.  Cardiovascular: Normal rate.   Pulmonary/Chest: Effort normal. No respiratory distress.  Abdominal: She exhibits no distension.  Musculoskeletal: Normal range of motion.  Neurological: She is alert. She is not disoriented. Coordination and gait normal. GCS eye subscore is 4. GCS verbal subscore is 5. GCS motor subscore is 6.  Skin: Skin is warm and dry. She is not diaphoretic.  Psychiatric: She has a normal mood and affect. Her behavior is normal.  Nursing note and vitals reviewed.    ED Treatments / Results   DIAGNOSTIC STUDIES: Oxygen Saturation is 100% on RA,  normal by my interpretation.   COORDINATION OF CARE: 12:10 AM-Discussed next steps with pt. Pt verbalized understanding and is agreeable with the plan.    Labs (all labs ordered are listed, but only abnormal results are displayed) Labs Reviewed - No data to display  EKG  EKG Interpretation None       Radiology No results found.  Procedures Procedures (including critical care time)  Medications Ordered in ED Medications  gatifloxacin (ZYMAXID) 0.5 % ophthalmic drops 1 drop (not administered)  tetracaine (PONTOCAINE) 0.5 % ophthalmic solution 2 drop (2 drops Both Eyes Given 12/15/16 2118)  fluorescein ophthalmic strip 1 strip (1 strip Both Eyes Given 12/15/16 2118)  Initial Impression / Assessment and Plan / ED Course  I have reviewed the triage vital signs and the nursing notes.  Pertinent labs & imaging results that were available during my care of the patient were reviewed by me and considered in my medical decision making (see chart for details).  Clinical Course     Patient presents to ED with complaint of right eye redness s/p chemical exposure at 6:30pm. Patient reports she was trying to remove her contact lens when she accidentally grabbed ear wax removal liquids instead of her eye drops. Chemical substance was carbamide peroxide. She irrigated her eye; however, reports redness and retained contact lens. Denies visual disturbance, discharge, photophobia, pain with eye movements. Patient is afebrile and non-toxic appearing in NAD. VSS.  Right eye redness. No surrounding erythema or swelling. PERRL. EOMI. V/A re-assuring. No foreign bodies noted, unable to visualize contact lens. PH 7-8. Nml IOP. No cornea abrasions/ulcerations or dendritic lesions. No seidel cell.  No signs of iritis. Spoke with Margarita Grizzle of Poison control who recommended irrigation with [employer].   Irrigation with morgan lens performed. On repeat examination unable to visualize contact lens.  Discussed results and plan with patient. Pt placed on ophthalmic ABX drop. Encouraged pt to follow up with ophthalmologist on Tuesday for re-evaluation. Strict return precautions given to include visual changes, entrapment, and purulent discharge. Pt voiced understanding and is agreeable.   Final Clinical Impressions(s) / ED Diagnoses   Final diagnoses:  Acute conjunctivitis of right eye, unspecified acute conjunctivitis type    New Prescriptions Discharge Medication List as of 12/15/2016 11:20 PM    START taking these medications   Details  gatifloxacin (ZYMAXID) 0.5 % SOLN 1 drop every 2 hours (do not exceed 4 doses) while awake for first 2 days. Following one drop every 6hrs for 5 days, Print       I personally performed the services described in this documentation, which was scribed in my presence. The recorded information has been reviewed and is accurate.    Roxanna Mew, PA-C 12/16/16 [place], MD 12/16/16 [phone]

## 2016-12-15 NOTE — ED Notes (Signed)
Pt departed in NAD.  

## 2017-04-15 LAB — HM PAP SMEAR

## 2017-05-26 ENCOUNTER — Ambulatory Visit (INDEPENDENT_AMBULATORY_CARE_PROVIDER_SITE_OTHER): Payer: BC Managed Care – PPO | Admitting: Family Medicine

## 2017-05-26 ENCOUNTER — Encounter: Payer: Self-pay | Admitting: Family Medicine

## 2017-05-26 VITALS — BP 110/70 | HR 73 | Temp 98.3°F | Wt 129.2 lb

## 2017-05-26 DIAGNOSIS — R519 Headache, unspecified: Secondary | ICD-10-CM

## 2017-05-26 DIAGNOSIS — R200 Anesthesia of skin: Secondary | ICD-10-CM

## 2017-05-26 DIAGNOSIS — R51 Headache: Secondary | ICD-10-CM | POA: Diagnosis not present

## 2017-05-26 LAB — COMPREHENSIVE METABOLIC PANEL
ALBUMIN: 4.6 g/dL (ref 3.6–5.1)
ALT: 21 U/L (ref 6–29)
AST: 22 U/L (ref 10–35)
Alkaline Phosphatase: 77 U/L (ref 33–115)
BILIRUBIN TOTAL: 0.8 mg/dL (ref 0.2–1.2)
BUN: 10 mg/dL (ref 7–25)
CALCIUM: 9.1 mg/dL (ref 8.6–10.2)
CO2: 26 mmol/L (ref 20–31)
Chloride: 104 mmol/L (ref 98–110)
Creat: 0.73 mg/dL (ref 0.50–1.10)
Glucose, Bld: 111 mg/dL — ABNORMAL HIGH (ref 65–99)
POTASSIUM: 3.9 mmol/L (ref 3.5–5.3)
Sodium: 140 mmol/L (ref 135–146)
Total Protein: 7.5 g/dL (ref 6.1–8.1)

## 2017-05-26 LAB — CBC WITH DIFFERENTIAL/PLATELET
BASOS ABS: 58 {cells}/uL (ref 0–200)
Basophils Relative: 1 %
EOS ABS: 58 {cells}/uL (ref 15–500)
Eosinophils Relative: 1 %
HEMATOCRIT: 41.4 % (ref 35.0–45.0)
HEMOGLOBIN: 13.7 g/dL (ref 11.7–15.5)
LYMPHS ABS: 1682 {cells}/uL (ref 850–3900)
Lymphocytes Relative: 29 %
MCH: 29.5 pg (ref 27.0–33.0)
MCHC: 33.1 g/dL (ref 32.0–36.0)
MCV: 89 fL (ref 80.0–100.0)
MONO ABS: 348 {cells}/uL (ref 200–950)
MPV: 9.3 fL (ref 7.5–12.5)
Monocytes Relative: 6 %
NEUTROS PCT: 63 %
Neutro Abs: 3654 cells/uL (ref 1500–7800)
Platelets: 333 10*3/uL (ref 140–400)
RBC: 4.65 MIL/uL (ref 3.80–5.10)
RDW: 13.6 % (ref 11.0–15.0)
WBC: 5.8 10*3/uL (ref 4.0–10.5)

## 2017-05-26 LAB — TSH: TSH: 1.95 m[IU]/L
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
## 2017-05-26 NOTE — Patient Instructions (Signed)
We will call you with lab results. If you notice any new or worsening symptoms let me know.

## 2017-05-27 LAB — SEDIMENTATION RATE: Sed Rate: 14 mm/hr (ref 0–20)

## 2017-05-27 LAB — VITAMIN B12: VITAMIN B 12: 643 pg/mL (ref 200–1100)

## 2017-05-27 LAB — RPR

## 2017-05-28 ENCOUNTER — Other Ambulatory Visit: Payer: Self-pay | Admitting: Family Medicine

## 2017-05-28 ENCOUNTER — Emergency Department (HOSPITAL_COMMUNITY): Payer: BC Managed Care – PPO

## 2017-05-28 ENCOUNTER — Emergency Department (HOSPITAL_COMMUNITY)
Admission: EM | Admit: 2017-05-28 | Discharge: 2017-05-28 | Disposition: A | Discharge status: Home / Self Care | Payer: BC Managed Care – PPO | Attending: Emergency Medicine | Admitting: Emergency Medicine

## 2017-05-28 ENCOUNTER — Encounter (HOSPITAL_COMMUNITY): Payer: Self-pay | Admitting: *Deleted

## 2017-05-28 DIAGNOSIS — R200 Anesthesia of skin: Secondary | ICD-10-CM | POA: Diagnosis not present

## 2017-05-28 DIAGNOSIS — R202 Paresthesia of skin: Principal | ICD-10-CM

## 2017-05-28 DIAGNOSIS — Z7982 Long term (current) use of aspirin: Secondary | ICD-10-CM | POA: Insufficient documentation

## 2017-05-28 LAB — DIFFERENTIAL
BASOS ABS: 0.1 10*3/uL (ref 0.0–0.1)
Basophils Relative: 1 %
Eosinophils Absolute: 0 10*3/uL (ref 0.0–0.7)
Eosinophils Relative: 1 %
LYMPHS PCT: 26 %
Lymphs Abs: 1.9 10*3/uL (ref 0.7–4.0)
Monocytes Absolute: 0.3 10*3/uL (ref 0.1–1.0)
Monocytes Relative: 4 %
NEUTROS PCT: 70 %
Neutro Abs: 5.1 10*3/uL (ref 1.7–7.7)

## 2017-05-28 LAB — COMPREHENSIVE METABOLIC PANEL
ALBUMIN: 4.5 g/dL (ref 3.5–5.0)
ALT: 24 U/L (ref 14–54)
AST: 27 U/L (ref 15–41)
Alkaline Phosphatase: 76 U/L (ref 38–126)
Anion gap: 7 (ref 5–15)
BILIRUBIN TOTAL: 1.4 mg/dL — AB (ref 0.3–1.2)
BUN: 12 mg/dL (ref 6–20)
CALCIUM: 9.3 mg/dL (ref 8.9–10.3)
CO2: 27 mmol/L (ref 22–32)
Chloride: 103 mmol/L (ref 101–111)
Creatinine, Ser: 0.7 mg/dL (ref 0.44–1.00)
GFR calc Af Amer: >60 mL/min (ref >60)
GFR calc non Af Amer: >60 mL/min (ref >60)
GLUCOSE: 101 mg/dL — AB (ref 65–99)
POTASSIUM: 3.7 mmol/L (ref 3.5–5.1)
SODIUM: 137 mmol/L (ref 135–145)
TOTAL PROTEIN: 7.4 g/dL (ref 6.5–8.1)

## 2017-05-28 LAB — I-STAT TROPONIN, ED: Troponin i, poc: 0 ng/mL (ref 0.00–0.08)

## 2017-05-28 LAB — I-STAT CHEM 8, ED
BUN: 14 mg/dL (ref 6–20)
CALCIUM ION: 1.19 mmol/L (ref 1.15–1.40)
CHLORIDE: 102 mmol/L (ref 101–111)
Creatinine, Ser: 0.7 mg/dL (ref 0.44–1.00)
Glucose, Bld: 103 mg/dL — ABNORMAL HIGH (ref 65–99)
HCT: 41 % (ref 36.0–46.0)
Hemoglobin: 13.9 g/dL (ref 12.0–15.0)
POTASSIUM: 3.6 mmol/L (ref 3.5–5.1)
SODIUM: 140 mmol/L (ref 135–145)
TCO2: 29 mmol/L (ref 0–100)

## 2017-05-28 LAB — CBC
HCT: 41.1 % (ref 36.0–46.0)
HEMOGLOBIN: 13.6 g/dL (ref 12.0–15.0)
MCH: 29.6 pg (ref 26.0–34.0)
MCHC: 33.1 g/dL (ref 30.0–36.0)
MCV: 89.3 fL (ref 78.0–100.0)
PLATELETS: 307 10*3/uL (ref 150–400)
RBC: 4.6 MIL/uL (ref 3.87–5.11)
RDW: 12.8 % (ref 11.5–15.5)
WBC: 7.3 10*3/uL (ref 4.0–10.5)

## 2017-05-28 LAB — PROTIME-INR
INR: 0.92
Prothrombin Time: 12.4 seconds (ref 11.4–15.2)

## 2017-05-28 LAB — APTT: APTT: 34 s (ref 24–36)

## 2017-05-28 LAB — CBG MONITORING, ED: GLUCOSE-CAPILLARY: 117 mg/dL — AB (ref 65–99)

## 2017-05-28 MED ORDER — PREDNISONE 20 MG PO TABS
60.0000 mg | ORAL_TABLET | ORAL | Status: AC
  Administered 2017-05-28: 60 mg via ORAL
  Filled 2017-05-28: qty 3

## 2017-05-28 MED ORDER — PREDNISONE 20 MG PO TABS: 40.0000 mg | ORAL_TABLET | Freq: Every day | ORAL | 0 refills | Status: DC

## 2017-05-28 NOTE — ED Notes (Signed)
Patient transported to CT 

## 2017-05-28 NOTE — ED Notes (Signed)
Dr Leonette Monarch evalutated pt in triage and feels it is not a stroke.
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
## 2017-05-28 NOTE — Discharge Instructions (Signed)
As discussed, your evaluation today has been largely reassuring.  But, it is important that you monitor your condition carefully, and do not hesitate to return to the ED if you develop new, or concerning changes in your condition. ? ?Otherwise, please follow-up with your physician for appropriate ongoing care. ? ?

## 2017-05-28 NOTE — ED Notes (Signed)
Dr. Vanita Panda explained tests results and discharge plan to pt.
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
## 2017-06-03 ENCOUNTER — Telehealth: Payer: Self-pay

## 2017-06-03 ENCOUNTER — Encounter: Payer: Self-pay | Admitting: Neurology

## 2017-06-03 NOTE — Telephone Encounter (Signed)
Please make referral for her. Thanks.

## 2017-06-03 NOTE — Telephone Encounter (Signed)
Pt stopped the office to request referral to [facility] at [facility] for her facial numbness. Fax # is [phone].   CB # [id number]. Elizabeth [name]

## 2017-06-04 ENCOUNTER — Telehealth: Payer: Self-pay

## 2017-06-04 NOTE — Telephone Encounter (Signed)
Faxed over referral to [facility]

## 2017-06-04 NOTE — Telephone Encounter (Signed)
Left message for patient to return call regarding pre- visit information.

## 2017-06-05 ENCOUNTER — Encounter: Payer: Self-pay | Admitting: Family Medicine

## 2017-06-05 ENCOUNTER — Ambulatory Visit (INDEPENDENT_AMBULATORY_CARE_PROVIDER_SITE_OTHER): Payer: BC Managed Care – PPO | Admitting: Family Medicine

## 2017-06-05 VITALS — BP 90/58 | HR 79 | Temp 98.2°F | Ht 60.0 in | Wt 128.0 lb

## 2017-06-05 DIAGNOSIS — R200 Anesthesia of skin: Secondary | ICD-10-CM

## 2017-06-05 DIAGNOSIS — R29898 Other symptoms and signs involving the musculoskeletal system: Secondary | ICD-10-CM

## 2017-06-05 NOTE — Patient Instructions (Signed)
I am please to hear you are improving. Around 80-85% of people will improve after 3 weeks, but some people need a little more time if it is Bell's Palsy.   If you do not hear anything about your referral in the next week call our office and ask for an update.  Good luck in [place]! Let us know if you need anything before you get there.
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
## 2017-06-09 ENCOUNTER — Encounter: Payer: Self-pay | Admitting: Family Medicine

## 2017-06-09 NOTE — Progress Notes (Signed)
Dismissal letter sent to patient.

## 2017-06-10 ENCOUNTER — Telehealth: Payer: Self-pay | Admitting: Family Medicine

## 2017-06-10 NOTE — Telephone Encounter (Signed)
That's fine. Her episodes were thought to be related to a virus. TY.

## 2017-06-10 NOTE — Telephone Encounter (Signed)
Pt came states appt Thursday 06/12/17 with dr Edsel Petrin [facility] [phone] and fax [phone]. Pt needs medical clearance to have dental work done. Please contact dental office.

## 2017-06-10 NOTE — Telephone Encounter (Signed)
Spoke with dental office. Pt is supposed to be having "restorative" care completed on 06/12/17 and had previously mentioned to the Dentist her "bell's palsy type symptoms" and dentist is wanting letter stating she is able to have topical / local Ceptocaine 4% for numbing prior to procedure. States no gas or other anesthesia will be used for this procedure.  Please advise?

## 2017-06-11 NOTE — Telephone Encounter (Signed)
Letter printed and placed in PCP red folder for signature along with fax cover sheet.

## 2017-06-11 NOTE — Telephone Encounter (Signed)
Letter faxed to below # and pt has been notified.

## 2017-09-05 ENCOUNTER — Ambulatory Visit: Payer: BC Managed Care – PPO | Admitting: Neurology

## 2019-01-14 IMAGING — CT CT HEAD W/O CM
4 series · 17 of 47 positions shown, 19 images · non-contrast
Comparison: None.

CLINICAL DATA: Left facial numbness. Left arm weakness and
tingling.

EXAM:
CT HEAD WITHOUT CONTRAST
TECHNIQUE: Contiguous axial images were obtained from the base of the skull
through the vertex without intravenous contrast.

[Series 4: head without · axial · non-contrast · 0.41mm/px · z∈[-69,+51]mm · 7 of 32 slices shown, 9 images]
[im 4/32  brain]
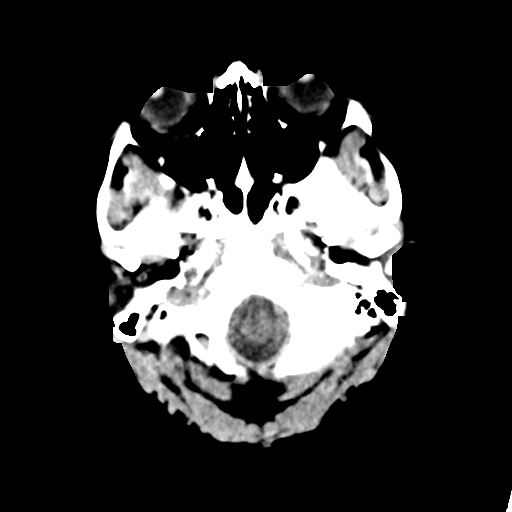
[im 4/32  bone]
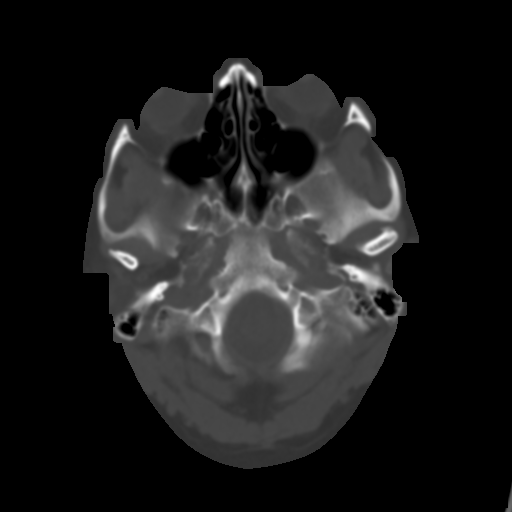
[im 8/32  brain]
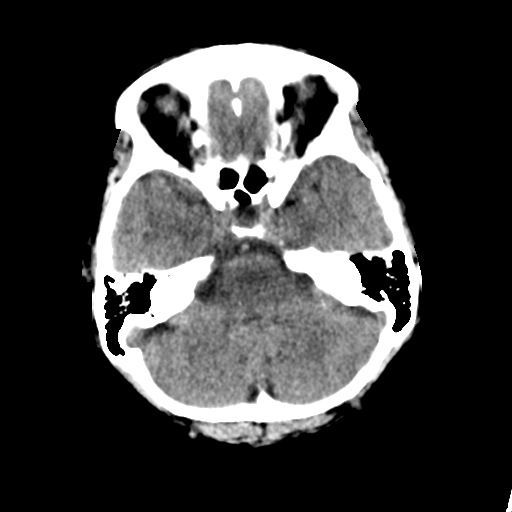
[im 12/32  brain]
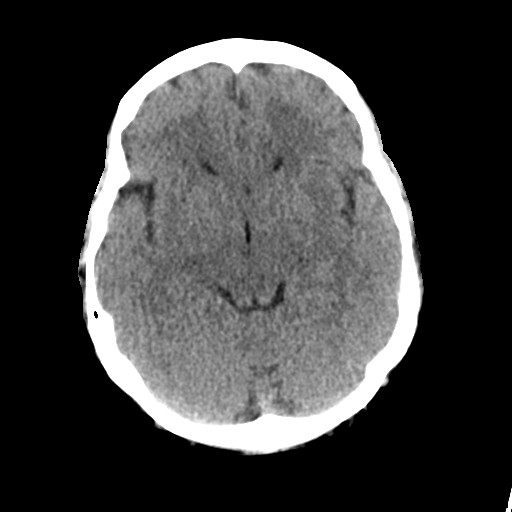
[im 16/32  brain]
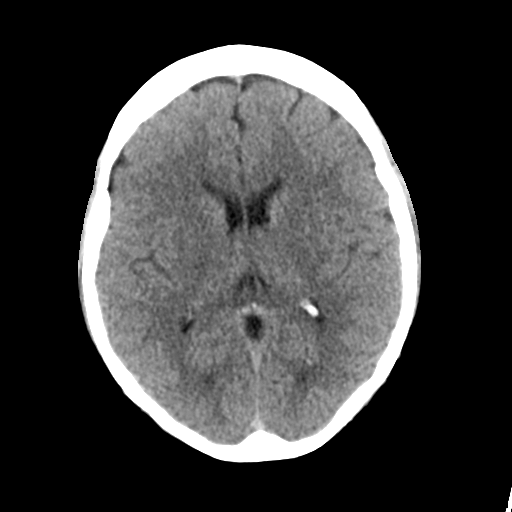
[im 20/32  brain]
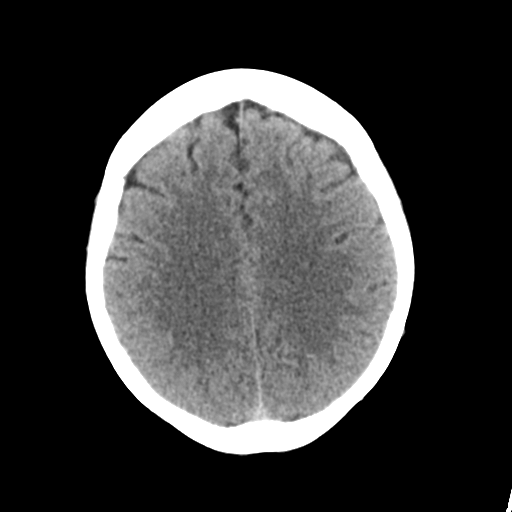
[im 20/32  bone]
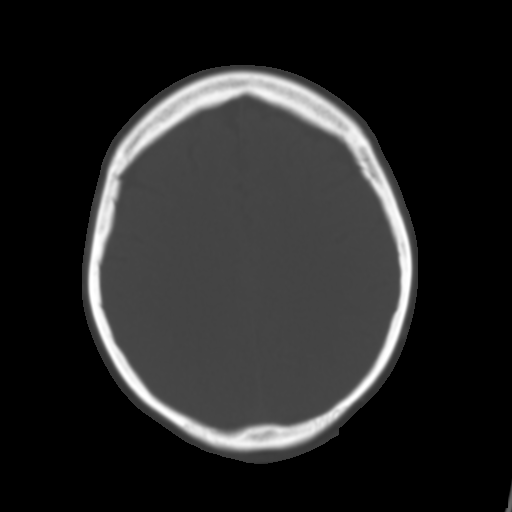
[im 24/32  brain]
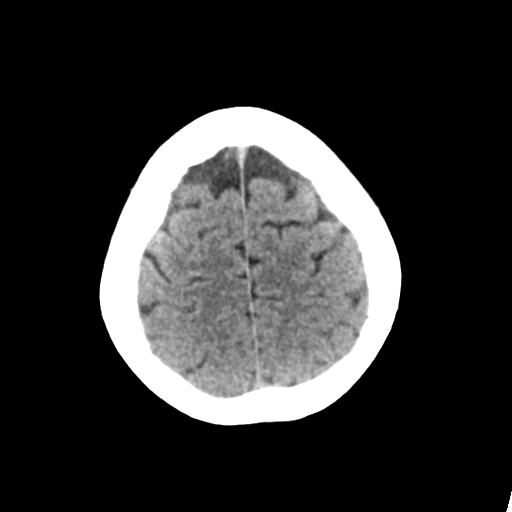
[im 28/32  brain]
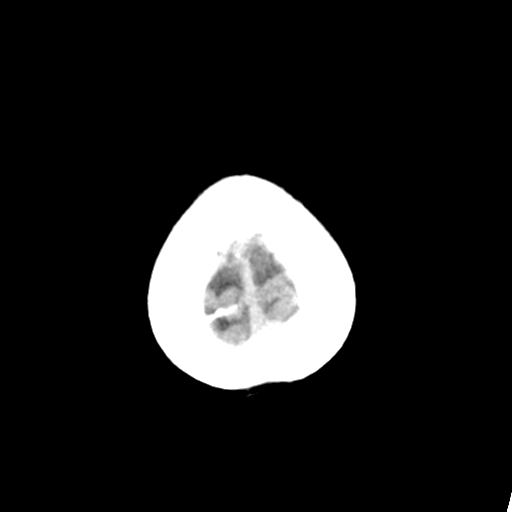

[Series 5: head bone · axial · 0.41mm/px · z∈[-70,-16]mm · 4 of 78 slices shown]
[im 8/78  bone]
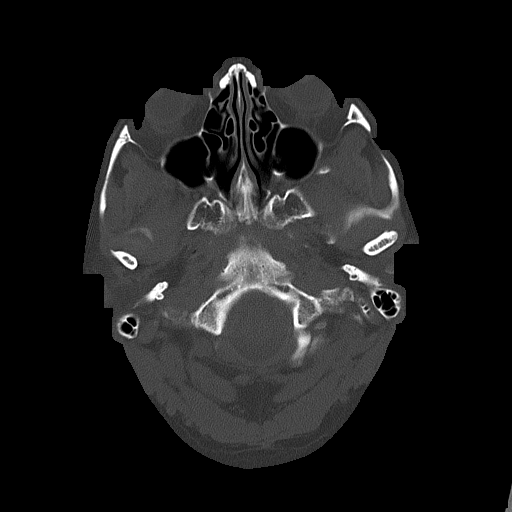
[im 16/78  bone]
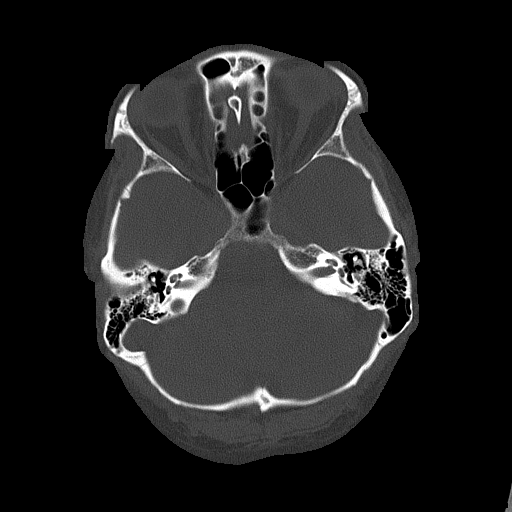
[im 24/78  bone]
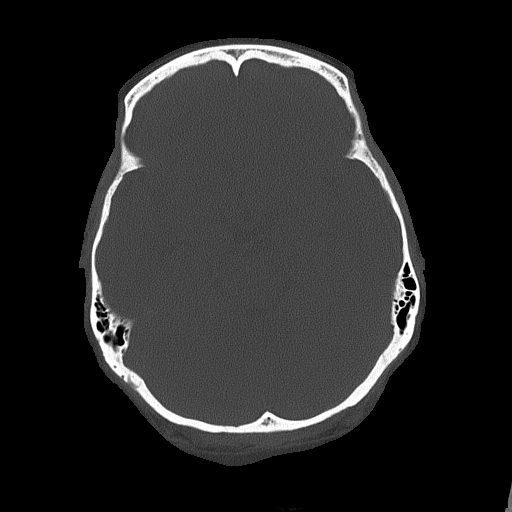
[im 35/78  bone]
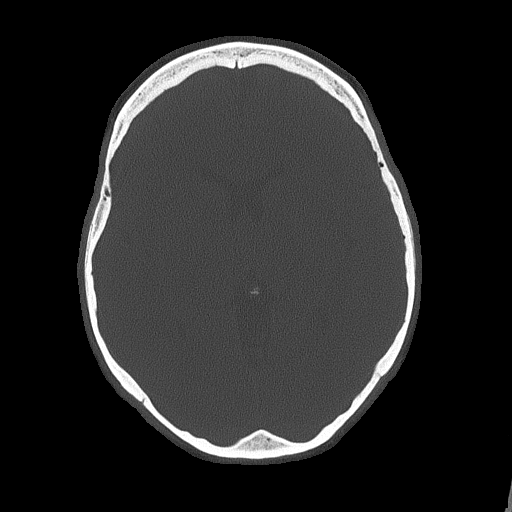

[Series 6: head without cor · coronal · non-contrast · 0.31mm/px · 3 of 67 slices shown]
[im 23/67  brain]
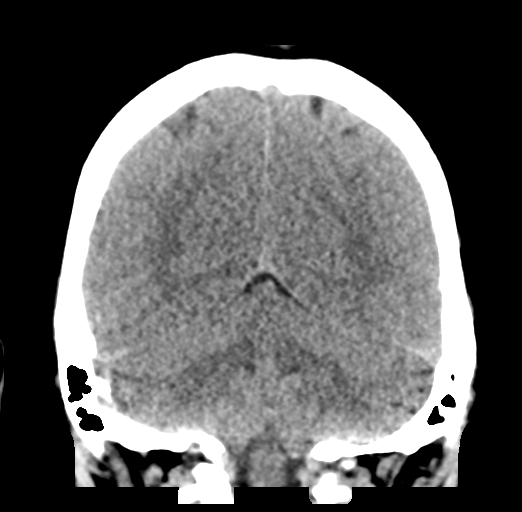
[im 30/67  brain]
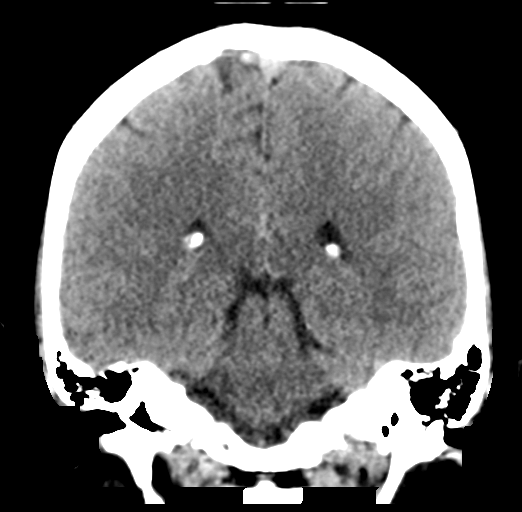
[im 37/67  brain]
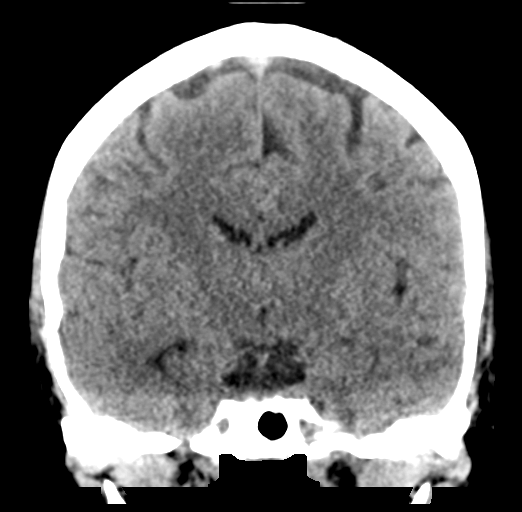

[Series 7: head without sag · sagittal · non-contrast · 0.30mm/px · 3 of 52 slices shown]
[im 18/52  brain]
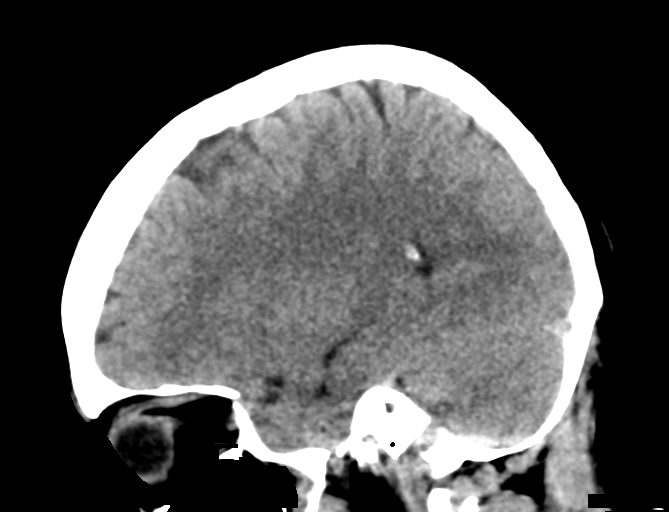
[im 26/52  brain]
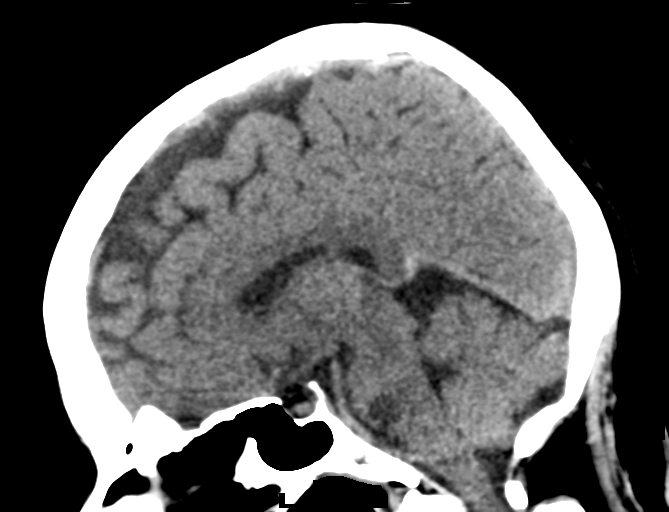
[im 35/52  brain]
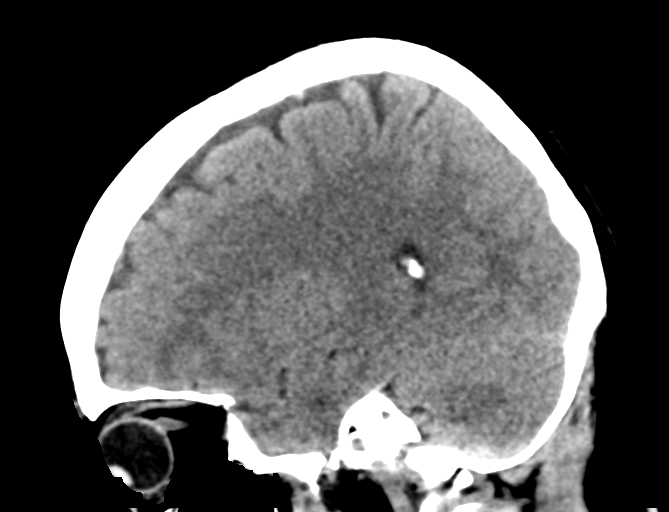

[17 of 47 positions shown; findings below may reference images not displayed]

FINDINGS: Brain: No mass lesion, intraparenchymal hemorrhage or extra-axial
collection. No evidence of acute cortical infarct. Brain parenchyma
and CSF-containing spaces are normal for age.

Vascular: No hyperdense vessel or unexpected calcification.

Skull: Normal visualized skull base, calvarium and extracranial soft
tissues.

Sinuses/Orbits: No sinus fluid levels or advanced mucosal
thickening. No mastoid effusion. Normal orbits.
IMPRESSION: Normal head CT.

## 2024-03-17 ENCOUNTER — Emergency Department (HOSPITAL_BASED_OUTPATIENT_CLINIC_OR_DEPARTMENT_OTHER)

## 2024-03-17 ENCOUNTER — Encounter (HOSPITAL_BASED_OUTPATIENT_CLINIC_OR_DEPARTMENT_OTHER): Payer: Self-pay

## 2024-03-17 ENCOUNTER — Other Ambulatory Visit: Payer: Self-pay

## 2024-03-17 ENCOUNTER — Emergency Department (HOSPITAL_BASED_OUTPATIENT_CLINIC_OR_DEPARTMENT_OTHER)
Admission: EM | Admit: 2024-03-17 | Discharge: 2024-03-17 | Disposition: A | Discharge status: Home / Self Care | Attending: Emergency Medicine | Admitting: Emergency Medicine

## 2024-03-17 DIAGNOSIS — M545 Low back pain, unspecified: Secondary | ICD-10-CM | POA: Diagnosis not present

## 2024-03-17 DIAGNOSIS — R1032 Left lower quadrant pain: Secondary | ICD-10-CM | POA: Diagnosis not present

## 2024-03-17 DIAGNOSIS — M62830 Muscle spasm of back: Secondary | ICD-10-CM | POA: Diagnosis present

## 2024-03-17 DIAGNOSIS — Z7982 Long term (current) use of aspirin: Secondary | ICD-10-CM | POA: Diagnosis not present

## 2024-03-17 LAB — CBC WITH DIFFERENTIAL/PLATELET
Abs Immature Granulocytes: 0.01 10*3/uL (ref 0.00–0.07)
Basophils Absolute: 0.1 10*3/uL (ref 0.0–0.1)
Basophils Relative: 1 %
Eosinophils Absolute: 0.1 10*3/uL (ref 0.0–0.5)
Eosinophils Relative: 1 %
HCT: 35.8 % — ABNORMAL LOW (ref 36.0–46.0)
Hemoglobin: 12.3 g/dL (ref 12.0–15.0)
Immature Granulocytes: 0 %
Lymphocytes Relative: 40 %
Lymphs Abs: 2.5 10*3/uL (ref 0.7–4.0)
MCH: 29.9 pg (ref 26.0–34.0)
MCHC: 34.4 g/dL (ref 30.0–36.0)
MCV: 86.9 fL (ref 80.0–100.0)
Monocytes Absolute: 0.4 10*3/uL (ref 0.1–1.0)
Monocytes Relative: 6 %
Neutro Abs: 3.2 10*3/uL (ref 1.7–7.7)
Neutrophils Relative %: 52 %
Platelets: 297 10*3/uL (ref 150–400)
RBC: 4.12 MIL/uL (ref 3.87–5.11)
RDW: 12.6 % (ref 11.5–15.5)
WBC: 6.2 10*3/uL (ref 4.0–10.5)
nRBC: 0 % (ref 0.0–0.2)

## 2024-03-17 LAB — URINALYSIS, ROUTINE W REFLEX MICROSCOPIC
Bacteria, UA: NONE SEEN
Bilirubin Urine: NEGATIVE
Glucose, UA: NEGATIVE mg/dL
Ketones, ur: NEGATIVE mg/dL
Leukocytes,Ua: NEGATIVE
Nitrite: NEGATIVE
Specific Gravity, Urine: >1.046 — ABNORMAL HIGH (ref 1.005–1.030)
pH: 6 (ref 5.0–8.0)

## 2024-03-17 LAB — BASIC METABOLIC PANEL WITH GFR
Anion gap: 8 (ref 5–15)
BUN: 13 mg/dL (ref 6–20)
CO2: 26 mmol/L (ref 22–32)
Calcium: 9 mg/dL (ref 8.9–10.3)
Chloride: 104 mmol/L (ref 98–111)
Creatinine, Ser: 0.77 mg/dL (ref 0.44–1.00)
GFR, Estimated: >60 mL/min (ref >60)
Glucose, Bld: 114 mg/dL — ABNORMAL HIGH (ref 70–99)
Potassium: 3.7 mmol/L (ref 3.5–5.1)
Sodium: 138 mmol/L (ref 135–145)

## 2024-03-17 MED ORDER — IBUPROFEN 600 MG PO TABS: 600.0000 mg | ORAL_TABLET | Freq: Four times a day (QID) | ORAL | 0 refills | Status: AC | PRN

## 2024-03-17 MED ORDER — IOHEXOL 300 MG/ML  SOLN
100.0000 mL | Freq: Once | INTRAMUSCULAR | Status: AC | PRN
  Administered 2024-03-17: 100 mL via INTRAVENOUS

## 2024-03-17 MED ORDER — KETOROLAC TROMETHAMINE 30 MG/ML IJ SOLN
30.0000 mg | Freq: Once | INTRAMUSCULAR | Status: AC
  Administered 2024-03-17: 30 mg via INTRAVENOUS
  Filled 2024-03-17: qty 1

## 2024-03-17 MED ORDER — OXYCODONE-ACETAMINOPHEN 5-325 MG PO TABS: 1.0000 | ORAL_TABLET | Freq: Three times a day (TID) | ORAL | 0 refills | Status: AC | PRN

## 2024-03-17 MED ORDER — ONDANSETRON HCL 4 MG/2ML IJ SOLN
4.0000 mg | Freq: Once | INTRAMUSCULAR | Status: AC
  Administered 2024-03-17: 4 mg via INTRAVENOUS
  Filled 2024-03-17: qty 2

## 2024-03-17 NOTE — ED Notes (Addendum)
 Pain 4/10 up to bathroom to collect sample

## 2024-03-17 NOTE — ED Triage Notes (Signed)
 Pt arrives ambulatory to ED with reports of left sided flank pain X2 days was taking tylenol at home but never took the pain away totally. Pt went to UC today and states that she had blood in her urine and was told to come to ED for evaluation of possible kidney stone.

## 2024-03-17 NOTE — ED Notes (Signed)
Warm blankets given, call bell in reach.

## 2024-03-17 NOTE — ED Provider Notes (Signed)
 [place] EMERGENCY DEPARTMENT AT [facility] Provider Note   CSN: [id number] Arrival date & time: 03/17/24  1924     History  Chief Complaint  Patient presents with   Back Pain    Elizabeth [name] is a 53 y.o. female presenting to ED with 2 days of left flank pain and left upper and lower quadrant abdominal pain.  Patient reports she does suffer from back spasms and muscle spasms and problems with her spine, but this pain feels different.  She said the pain was intolerable today prompting a visit to urgent care, who evaluated her and then told her to come to the ED for further evaluation potential CT scan.  She was noted to have blood in her urine at urgent care and is postmenopausal.  No prior history of kidney stones.  She reports that she has felt nauseated, had diarrhea but also intermittently constipated for several days.  HPI     Home Medications Prior to Admission medications   Medication Sig Start Date End Date Taking? Authorizing Provider  ibuprofen (ADVIL) 600 MG tablet Take 1 tablet (600 mg total) by mouth every 6 (six) hours as needed for up to 30 doses for mild pain (pain score 1-3) or moderate pain (pain score 4-6). 03/17/24  Yes Gabriella Guile, Kermit Balo, MD  oxyCODONE-acetaminophen (PERCOCET/ROXICET) 5-325 MG tablet Take 1 tablet by mouth every 8 (eight) hours as needed for up to 12 doses for severe pain (pain score 7-10). 03/17/24  Yes Zoriana Oats, Kermit Balo, MD  aspirin EC 81 MG tablet Take 81 mg by mouth daily.    [provider]  ibuprofen (ADVIL,MOTRIN) 600 MG tablet Take 1 tablet (600 mg total) by mouth every 6 (six) hours. Patient taking differently: Take 600 mg by mouth every 6 (six) hours as needed for moderate pain.  09/22/14   Donette Larry, CNM  Multiple Vitamin (MULTIVITAMIN WITH MINERALS) TABS tablet Take 1 tablet by mouth daily.    [provider]  omega-3 acid ethyl esters (LOVAZA) 1 g capsule Take 1 g by mouth daily.    [provider]       Allergies    Penicillins    Review of Systems   Review of Systems  Physical Exam Updated Vital Signs BP 101/74   Pulse 62   Temp 97.8 F (36.6 C) (Oral)   Resp 18   Ht 5\' 1"  (1.549 m)   Wt 59 kg   LMP 05/20/2017   SpO2 100%   BMI 24.56 kg/m  Physical Exam Constitutional:      General: She is not in acute distress. HENT:     Head: Normocephalic and atraumatic.  Eyes:     Conjunctiva/sclera: Conjunctivae normal.     Pupils: Pupils are equal, round, and reactive to light.  Cardiovascular:     Rate and Rhythm: Normal rate and regular rhythm.  Pulmonary:     Effort: Pulmonary effort is normal. No respiratory distress.  Abdominal:     General: There is no distension.     Tenderness: There is abdominal tenderness in the left lower quadrant. There is left CVA tenderness. There is no right CVA tenderness.  Skin:    General: Skin is warm and dry.  Neurological:     General: No focal deficit present.     Mental Status: She is alert. Mental status is at baseline.  Psychiatric:        Mood and Affect: Mood normal.  Behavior: Behavior normal.     ED Results / Procedures / Treatments   Labs (all labs ordered are listed, but only abnormal results are displayed) Labs Reviewed  URINALYSIS, ROUTINE W REFLEX MICROSCOPIC - Abnormal; Notable for the following components:      Result Value   Color, Urine COLORLESS (*)    Specific Gravity, Urine >1.046 (*)    Hgb urine dipstick SMALL (*)    Protein, ur TRACE (*)    All other components within normal limits  CBC WITH DIFFERENTIAL/PLATELET - Abnormal; Notable for the following components:   HCT 35.8 (*)    All other components within normal limits  BASIC METABOLIC PANEL WITH GFR - Abnormal; Notable for the following components:   Glucose, Bld 114 (*)    All other components within normal limits    EKG None  Radiology CT ABDOMEN PELVIS W CONTRAST Result Date: 03/17/2024 CLINICAL DATA:  Left flank pain for 2  days. Left lower quadrant pain and tenderness. Hematuria. EXAM: CT ABDOMEN AND PELVIS WITH CONTRAST TECHNIQUE: Multidetector CT imaging of the abdomen and pelvis was performed using the standard protocol following bolus administration of intravenous contrast. RADIATION DOSE REDUCTION: This exam was performed according to the departmental dose-optimization program which includes automated exposure control, adjustment of the mA and/or kV according to patient size and/or use of iterative reconstruction technique. CONTRAST:  OMNIPAQUE IOHEXOL 300 MG/ML  SOLN COMPARISON:  None Available. FINDINGS: Lower Chest: No acute findings. Hepatobiliary: No suspicious hepatic masses identified. Gallbladder is unremarkable. No evidence of biliary ductal dilatation. Pancreas:  No mass or inflammatory changes. Spleen: Within normal limits in size and appearance. Adrenals/Urinary Tract: No suspicious masses identified. No evidence of ureteral calculi or hydronephrosis. Stomach/Bowel: No evidence of obstruction, inflammatory process or abnormal fluid collections. Normal appendix visualized. Vascular/Lymphatic: No pathologically enlarged lymph nodes. No acute vascular findings. Reproductive: Uterine fibroid measuring 6.0 x 5.5 cm. Adnexal regions are unremarkable. Other:  None. Musculoskeletal:  No suspicious bone lesions identified. IMPRESSION: No acute findings. 6 cm uterine fibroid. Electronically Signed   By: Danae Orleans M.D.   On: 03/17/2024 21:43    Procedures Procedures    Medications Ordered in ED Medications  ketorolac (TORADOL) 30 MG/ML injection 30 mg (30 mg Intravenous Given 03/17/24 2003)  ondansetron (ZOFRAN) injection 4 mg (4 mg Intravenous Given 03/17/24 2006)  iohexol (OMNIPAQUE) 300 MG/ML solution 100 mL (100 mLs Intravenous Contrast Given 03/17/24 2051)    ED Course/ Medical Decision Making/ A&P                                 Medical Decision Making Amount and/or Complexity of Data Reviewed Labs:  ordered. Radiology: ordered.  Risk Prescription drug management.   This patient presents to the ED with concern for left CVA tenderness and left lower back pain and left upper quadrant abdominal pain. This involves an extensive number of treatment options, and is a complaint that carries with it a high risk of complications and morbidity.  The differential diagnosis includes ureteral colic versus kidney infection versus muscle spasm versus diverticulitis or colitis versus other   I ordered and personally interpreted labs.  The pertinent results include: No emergent findings.  There is a small amount of blood in the urine, no clear evidence of UTI  I ordered imaging studies including CT abdomen pelvis with contrast I independently visualized and interpreted imaging which showed no emergent findings I  agree with the radiologist interpretation  I ordered medication including IV Toradol for pain and IV Zofran for nausea  I have reviewed the patients home medicines and have made adjustments as needed  Test Considered: Doubt acute pelvic pathology, doubt PID, doubt ovarian torsion   After the interventions noted above, I reevaluated the patient and found that they have: stayed the same    Dispostion:  After consideration of the diagnostic results and the patients response to treatment, I feel that the patent would benefit from discharge with close outpatient follow-up.  She has a PCP appointment at the end of the week.  She is feeling comfortable going home, not requesting or needing any nausea medications.  I suspect her pain is most likely musculoskeletal in nature, I do not see an intra-abdominal cause.  It is possible that she passed a very small stone, not visible on CT scan, we discussed this as well.  She is comfortable with discharge         Final Clinical Impression(s) / ED Diagnoses Final diagnoses:  Low back pain, unspecified back pain laterality, unspecified chronicity,  unspecified whether sciatica present    Rx / DC Orders ED Discharge Orders          Ordered    oxyCODONE-acetaminophen (PERCOCET/ROXICET) 5-325 MG tablet  Every 8 hours PRN        03/17/24 2152    ibuprofen (ADVIL) 600 MG tablet  Every 6 hours PRN        03/17/24 2152              Terald Sleeper, MD 03/17/24 2153
